# Patient Record
Sex: Male | Born: 2000 | Race: White | Hispanic: No | Marital: Single | Smoking: Former smoker
Health system: Southern US, Community
[De-identification: ages and names within clinical notes are randomized; demographics above are authoritative.]

## PROBLEM LIST (undated history)

## (undated) DIAGNOSIS — F902 Attention-deficit hyperactivity disorder, combined type: Secondary | ICD-10-CM

## (undated) DIAGNOSIS — F419 Anxiety disorder, unspecified: Secondary | ICD-10-CM

## (undated) DIAGNOSIS — F909 Attention-deficit hyperactivity disorder, unspecified type: Secondary | ICD-10-CM

## (undated) HISTORY — DX: Attention-deficit hyperactivity disorder, combined type: F90.2

## (undated) HISTORY — PX: MOUTH SURGERY: SHX715

---
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
## 2020-01-19 ENCOUNTER — Ambulatory Visit (INDEPENDENT_AMBULATORY_CARE_PROVIDER_SITE_OTHER): Payer: Medicaid Other | Admitting: Psychiatry

## 2020-01-19 ENCOUNTER — Other Ambulatory Visit: Payer: Self-pay

## 2020-01-19 DIAGNOSIS — F411 Generalized anxiety disorder: Secondary | ICD-10-CM

## 2020-01-19 NOTE — BH Specialist Note (Signed)
Integrated Behavioral Health Follow Up Visit  MRN: [id number] Name: John Campbell  Number of Integrated Behavioral Health Clinician visits: 2/6 Session Start time: 2:18 pm  Session End time: 3:10 pm Total time: 52  Type of Service: Integrated Behavioral Health- Individual Interpretor:No. Interpretor Name and Language: NA  SUBJECTIVE: John Campbell is a 19 y.o. male accompanied by self Patient was referred by Dr. Georgeanne Nim for anxiety. Patient reports the following symptoms/concerns: having continued symptoms of anxiety but not identifying any stressors in particular.  Duration of problem: 1-2 months; Severity of problem: mild  OBJECTIVE: Mood: Pleasant and Affect: Appropriate Risk of harm to self or others: No plan to harm self or others  LIFE CONTEXT: Family and Social: Lives with his mother, father, and two brothers and shared that things are going okay in the home but he has desires to move back on his own again.  School/Work: Currently in his senior year at [employer].  Self-Care: Reports that he still gets anxious but has not had any panic attacks recently.  Life Changes: None at present.   GOALS ADDRESSED: Patient will: 1.  Reduce symptoms of: anxiety to less than 3 out of 7 days a week.  2.  Increase knowledge and/or ability of: coping skills  3.  Demonstrate ability to: Increase healthy adjustment to current life circumstances  INTERVENTIONS: Interventions utilized:  Motivational Interviewing and Brief CBT To build rapport and engage the patient in an activity that allowed the patient to share their interests, family and peer dynamics, and personal and therapeutic goals. The therapist used a visual to engage the patient in identifying how thoughts and feelings impact actions. They discussed ways to reduce negative thought patterns and use coping skills to reduce negative symptoms. Therapist praised this response and they explored what will be helpful in improving  reactions to emotions. Standardized Assessments completed: Not Needed  ASSESSMENT: Patient currently experiencing moments of anxiety due to stressors in his life. He explored how balancing school, moving back in with his parents, coping with his ex-girlfriend, parenting his son, and focusing on his own wellbeing can be difficult at times. They reviewed ways to set boundaries and use coping skills to challenge anxious thoughts and feelings. He shared that some of his coping strategies are: playing his video games, riding his dirt bike, hanging with friends, driving, listening to music, working on mechanical things, and talking to his younger brother.   Patient may benefit from individual counseling to improve his anxiety.  PLAN: 1. Follow up with behavioral health clinician in: one month 2. Behavioral recommendations: explore additional ways to cope and create a list; discuss and complete the can and cannot control activity.  3. Referral(s): Integrated [facility] (In Clinic) 4. "From scale of 1-10, how likely are you to follow plan?": 5  Jana Half, [facility]

## 2020-01-29 ENCOUNTER — Ambulatory Visit: Payer: Medicaid Other | Admitting: Pediatrics

## 2020-02-03 ENCOUNTER — Ambulatory Visit: Payer: Medicaid Other | Admitting: Pediatrics

## 2020-02-09 ENCOUNTER — Other Ambulatory Visit: Payer: Self-pay

## 2020-02-09 ENCOUNTER — Ambulatory Visit (INDEPENDENT_AMBULATORY_CARE_PROVIDER_SITE_OTHER): Payer: Medicaid Other | Admitting: Pediatrics

## 2020-02-09 ENCOUNTER — Encounter: Payer: Self-pay | Admitting: Pediatrics

## 2020-02-09 VITALS — BP 140/88 | HR 64 | Ht 70.39 in | Wt 179.0 lb

## 2020-02-09 DIAGNOSIS — F410 Panic disorder [episodic paroxysmal anxiety] without agoraphobia: Secondary | ICD-10-CM

## 2020-02-09 DIAGNOSIS — F411 Generalized anxiety disorder: Secondary | ICD-10-CM | POA: Diagnosis not present

## 2020-02-09 DIAGNOSIS — G4709 Other insomnia: Secondary | ICD-10-CM | POA: Diagnosis not present

## 2020-02-09 DIAGNOSIS — F902 Attention-deficit hyperactivity disorder, combined type: Secondary | ICD-10-CM

## 2020-02-09 MED ORDER — LISDEXAMFETAMINE DIMESYLATE 70 MG PO CAPS: 70.0000 mg | ORAL_CAPSULE | ORAL | 0 refills | Status: DC

## 2020-02-09 MED ORDER — SERTRALINE HCL 50 MG PO TABS: 50.0000 mg | ORAL_TABLET | Freq: Every day | ORAL | 2 refills | Status: DC

## 2020-02-09 MED ORDER — AMPHETAMINE-DEXTROAMPHETAMINE 15 MG PO TABS: ORAL_TABLET | ORAL | 0 refills | Status: DC

## 2020-02-09 MED ORDER — CLONIDINE HCL 0.1 MG PO TABS: 0.2000 mg | ORAL_TABLET | Freq: Every evening | ORAL | 2 refills | Status: DC | PRN

## 2020-02-09 NOTE — Progress Notes (Signed)
Name: John Campbell Age: 19 y.o. Sex: male DOB: 23-Jul-2000 MRN: [id number] Date of office visit: 02/09/2020    Chief Complaint  Patient presents with  . recheck ADHD  . recheck insomnia  . recheck anxiety    Here by himself     John Campbell is a 19 y.o. male here for recheck of ADHD.  The patient is the primary historian.  ADHD: The patient has ADHD combined type. He takes Vyvanse 70 mg in the morning and Adderall immediate release 15 mg in the afternoon. He feels he is doing well on his current medications. He is able to focus in school without difficulty.  Grade in School: 12th grade. Grades: B's and C's. School Performance Problems: None. Side Effects of Medication: Patient denies any side effects.  Sleep Problems: He is taking clonidine 0.1 mg at bedtime. He states he is sleeping well, 8-9 hours per night.  Behavior Problem: None. Extracurricular Activities: None. Anxiety: The patient is taking Zoloft for anxiety. At his last visit the dose was increased from 25 mg to 50 mg.  He feels his anxiety is well controlled at this dose. He has not had any panic attacks recently.  He denies side effects from the medication.  Past Medical History:  Diagnosis Date  . ADHD (attention deficit hyperactivity disorder), combined type   . Anxiety   . Other insomnia 12/09/2018     Outpatient Encounter Medications as of 02/09/2020  Medication Sig  . amphetamine-dextroamphetamine (ADDERALL) 15 MG tablet Take 1 tablet orally in the afternoon  . [START ON 03/10/2020] amphetamine-dextroamphetamine (ADDERALL) 15 MG tablet Take 1 tablet orally in the afternoon  . [START ON 04/09/2020] amphetamine-dextroamphetamine (ADDERALL) 15 MG tablet Take 1 tablet orally in the afternoon  . cloNIDine (CATAPRES) 0.1 MG tablet Take 2 tablets (0.2 mg total) by mouth at bedtime as needed.  Marland Kitchen EPINEPHrine 0.3 mg/0.3 mL IJ SOAJ injection Inject 0.3 mg into the muscle as needed for anaphylaxis.  Marland Kitchen lisdexamfetamine  (VYVANSE) 70 MG capsule Take 1 capsule (70 mg total) by mouth every morning.  Melene Muller ON 03/10/2020] lisdexamfetamine (VYVANSE) 70 MG capsule Take 1 capsule (70 mg total) by mouth every morning.  Melene Muller ON 04/09/2020] lisdexamfetamine (VYVANSE) 70 MG capsule Take 1 capsule (70 mg total) by mouth every morning.  . ondansetron (ZOFRAN) 4 MG tablet Take 1 tablet (4 mg total) by mouth every 8 (eight) hours as needed for nausea or vomiting.  . sertraline (ZOLOFT) 50 MG tablet Take 1 tablet (50 mg total) by mouth daily.  . [DISCONTINUED] amphetamine-dextroamphetamine (ADDERALL) 15 MG tablet Take 1 tablet by mouth daily in the afternoon.  . [DISCONTINUED] cloNIDine (CATAPRES) 0.1 MG tablet Take 2 tablets (0.2 mg total) by mouth at bedtime as needed.  . [DISCONTINUED] dicyclomine (BENTYL) 20 MG tablet Take 1 tablet (20 mg total) by mouth 4 (four) times daily -  before meals and at bedtime. (Patient not taking: Reported on 12/03/2019)  . [DISCONTINUED] lisdexamfetamine (VYVANSE) 70 MG capsule Take 1 capsule (70 mg total) by mouth every morning.  . [DISCONTINUED] sertraline (ZOLOFT) 50 MG tablet Take 1 tablet (50 mg total) by mouth daily.   No facility-administered encounter medications on file as of 02/09/2020.    Allergies  Allergen Reactions  . Latex Hives  . Other     Peaches, onions, almonds, sweet potatoes    Past Surgical History:  Procedure Laterality Date  . MOUTH SURGERY      History reviewed. No pertinent family  history.  Pediatric History  Patient Parents  . Fickle,Cheryl (Mother)  . Wurm,Lauren (Father)   Other Topics Concern  . Not on file  Social History Narrative  . Not on file     Review of Systems:  Constitutional: Negative for fever, malaise/fatigue and weight loss.  HENT: Negative for congestion and sore throat.   Eyes: Negative for discharge and redness.  Respiratory: Negative for cough.   Cardiovascular: Negative for chest pain and palpitations.    Gastrointestinal: Negative for abdominal pain.  Musculoskeletal: Negative for myalgias.  Skin: Negative for rash.  Neurological: Negative for dizziness and headaches.    Physical Exam:  BP 140/88   Pulse 64   Ht 5' 10.39" (1.788 m)   Wt 179 lb (81.2 kg)   SpO2 97%   BMI 25.40 kg/m  Wt Readings from Last 3 Encounters:  02/09/20 179 lb (81.2 kg) (82 %, Z= 0.93)*  12/28/19 174 lb 12.8 oz (79.3 kg) (79 %, Z= 0.82)*  12/04/19 181 lb (82.1 kg) (84 %, Z= 1.01)*   * Growth percentiles are based on CDC (Boys, 2-20 Years) data.     Body mass index is 25.4 kg/m. 80 %ile (Z= 0.83) based on CDC (Boys, 2-20 Years) BMI-for-age based on BMI available as of 02/09/2020.  Physical Exam  Constitutional: Patient appears well-developed and well-nourished.  Patient is active, awake, and alert.  HENT:  Nose: Nose normal. No nasal discharge.  Mouth/Throat: Mucous membranes are moist.  Eyes: Conjunctivae are normal.  Neck: Normal range of motion. Thyroid normal.  Cardiovascular: Regular rhythm. Pulmonary/Chest: Effort normal and breath sounds normal. No respiratory distress.  There is no wheezes, rhonchi, or crackles noted. Abdominal: Soft.  No masses palpated. There is no hepatosplenomegaly. There is no abdominal tenderness.  Musculoskeletal: Normal range of motion.  Neurological: Patient is alert.  Patient exhibits normal muscle tone.  Skin: No rash noted.   Assessment/Plan:  1. Attention deficit hyperactivity disorder (ADHD), combined type This patient has chronic ADHD.  The patient's current dose is controlling the symptoms adequately.  The medicine should be taken every day as directed. This includes weekends, weekdays, visiting with other family members, summertime, and holidays. It is important for routine, consistency, and structure, for the child to consistently get medicine and feel the same every day.  - lisdexamfetamine (VYVANSE) 70 MG capsule; Take 1 capsule (70 mg total) by mouth  every morning.  Dispense: 30 capsule; Refill: 0 - lisdexamfetamine (VYVANSE) 70 MG capsule; Take 1 capsule (70 mg total) by mouth every morning.  Dispense: 30 capsule; Refill: 0 - lisdexamfetamine (VYVANSE) 70 MG capsule; Take 1 capsule (70 mg total) by mouth every morning.  Dispense: 30 capsule; Refill: 0 - amphetamine-dextroamphetamine (ADDERALL) 15 MG tablet; Take 1 tablet orally in the afternoon  Dispense: 30 tablet; Refill: 0 - amphetamine-dextroamphetamine (ADDERALL) 15 MG tablet; Take 1 tablet orally in the afternoon  Dispense: 30 tablet; Refill: 0 - amphetamine-dextroamphetamine (ADDERALL) 15 MG tablet; Take 1 tablet orally in the afternoon  Dispense: 30 tablet; Refill: 0  2. Generalized anxiety disorder with panic attacks This patient has chronic anxiety which is now adequately treated with his current dose of Zoloft 50 mg.  This medication will continue to be prescribed for him at this dose.  He should take this medication on a consistent basis every day, even if he feels his anxiety has improved.  His anxiety has improved because of the medication, not despite the medication.  Therefore, he should continue to take the  medication consistently.  - sertraline (ZOLOFT) 50 MG tablet; Take 1 tablet (50 mg total) by mouth daily.  Dispense: 30 tablet; Refill: 2  3. Other insomnia This patient has chronic insomnia.  He has been stable on clonidine for quite some time.  He may continue to take clonidine at bedtime as needed to help with insomnia.  He should maintain appropriate and consistent sleep hygiene, going to bed at same time every night and getting up at the same time every day.  - cloNIDine (CATAPRES) 0.1 MG tablet; Take 2 tablets (0.2 mg total) by mouth at bedtime as needed.  Dispense: 60 tablet; Refill: 2   Meds ordered this encounter  Medications  . sertraline (ZOLOFT) 50 MG tablet    Sig: Take 1 tablet (50 mg total) by mouth daily.    Dispense:  30 tablet    Refill:  2  .  cloNIDine (CATAPRES) 0.1 MG tablet    Sig: Take 2 tablets (0.2 mg total) by mouth at bedtime as needed.    Dispense:  60 tablet    Refill:  2  . lisdexamfetamine (VYVANSE) 70 MG capsule    Sig: Take 1 capsule (70 mg total) by mouth every morning.    Dispense:  30 capsule    Refill:  0  . lisdexamfetamine (VYVANSE) 70 MG capsule    Sig: Take 1 capsule (70 mg total) by mouth every morning.    Dispense:  30 capsule    Refill:  0  . lisdexamfetamine (VYVANSE) 70 MG capsule    Sig: Take 1 capsule (70 mg total) by mouth every morning.    Dispense:  30 capsule    Refill:  0  . amphetamine-dextroamphetamine (ADDERALL) 15 MG tablet    Sig: Take 1 tablet orally in the afternoon    Dispense:  30 tablet    Refill:  0  . amphetamine-dextroamphetamine (ADDERALL) 15 MG tablet    Sig: Take 1 tablet orally in the afternoon    Dispense:  30 tablet    Refill:  0  . amphetamine-dextroamphetamine (ADDERALL) 15 MG tablet    Sig: Take 1 tablet orally in the afternoon    Dispense:  30 tablet    Refill:  0    Total personal time spent on the date of this encounter: 40 minutes.  Return in about 3 months (around 05/11/2020) for recheck ADHD/anxiety/insomnia.

## 2020-02-18 ENCOUNTER — Ambulatory Visit: Payer: Medicaid Other

## 2020-03-08 ENCOUNTER — Ambulatory Visit: Payer: Medicaid Other

## 2020-03-14 ENCOUNTER — Ambulatory Visit: Payer: Medicaid Other

## 2020-03-14 ENCOUNTER — Other Ambulatory Visit: Payer: Self-pay

## 2020-05-10 ENCOUNTER — Encounter: Payer: Self-pay | Admitting: Pediatrics

## 2020-05-10 ENCOUNTER — Other Ambulatory Visit: Payer: Self-pay

## 2020-05-10 ENCOUNTER — Ambulatory Visit (INDEPENDENT_AMBULATORY_CARE_PROVIDER_SITE_OTHER): Payer: Medicaid Other | Admitting: Pediatrics

## 2020-05-10 VITALS — BP 137/82 | HR 72 | Ht 69.96 in | Wt 185.8 lb

## 2020-05-10 DIAGNOSIS — G4709 Other insomnia: Secondary | ICD-10-CM | POA: Diagnosis not present

## 2020-05-10 DIAGNOSIS — F411 Generalized anxiety disorder: Secondary | ICD-10-CM

## 2020-05-10 DIAGNOSIS — F902 Attention-deficit hyperactivity disorder, combined type: Secondary | ICD-10-CM | POA: Diagnosis not present

## 2020-05-10 MED ORDER — LISDEXAMFETAMINE DIMESYLATE 70 MG PO CAPS
70.0000 mg | ORAL_CAPSULE | ORAL | 0 refills | Status: DC
Start: 2020-05-10 — End: 2020-08-09

## 2020-05-10 MED ORDER — AMPHETAMINE-DEXTROAMPHETAMINE 15 MG PO TABS: ORAL_TABLET | ORAL | 0 refills | Status: DC

## 2020-05-10 MED ORDER — SERTRALINE HCL 50 MG PO TABS: 50.0000 mg | ORAL_TABLET | Freq: Every day | ORAL | 2 refills | Status: DC

## 2020-05-10 MED ORDER — LISDEXAMFETAMINE DIMESYLATE 70 MG PO CAPS: 70.0000 mg | ORAL_CAPSULE | ORAL | 0 refills | Status: DC

## 2020-05-10 MED ORDER — CLONIDINE HCL 0.1 MG PO TABS: 0.2000 mg | ORAL_TABLET | Freq: Every evening | ORAL | 2 refills | Status: DC | PRN

## 2020-05-10 NOTE — Progress Notes (Signed)
Name: John Campbell Age: 20 y.o. Sex: male DOB: 22-Sep-2000 MRN: [id number] Date of office visit: 05/10/2020    Chief Complaint  Patient presents with  . Recheck ADHD  . Recheck insomnia  . Recheck anxiety    Patient is here by himself     John Campbell is a 20 y.o. male here for recheck of ADHD.  The patient is the primary historian.  ADHD: The patient has ADHD combined type. He takes Vyvanse 70mg  in the morning and Adderall 15mg  immediate release in the afternoon. The patient is tolerating his current medication regime well and is focusing well in school and extracurricular tasks. He sometimes skips his medication on the weekend days.  Grade in School: 12th grade. Grades: doing ok. Bs and Cs. He is confident he will graduate high school this year.  School Performance Problems: none. Side Effects of Medication: none. The patient denies loss of appetite, chest pain, heart palpitations, dizziness, shortness of breath, or feelings of depression. Sleep Problems: none. The patient takes Clonidine 0.1mg  2 tablets at bedtime. He is sleeping around 9 hours per night and does not have any trouble falling or staying asleep.  Behavior Problem: none. Extracurricular Activities: Rides motorcycle for fun. Anxiety: none. The patient takes Zoloft 50mg  daily for anxiety. He feels his anxiety is well controlled on this current medication. He describes rare feelings of anxiety which are situation.  His anxiety is mild compared to his anxiety level on the lower dose of Zoloft. He denies any recent panic attacks.    Past Medical History:  Diagnosis Date  . ADHD (attention deficit hyperactivity disorder), combined type   . Anxiety   . Other insomnia 12/09/2018     Outpatient Encounter Medications as of 05/10/2020  Medication Sig  . amphetamine-dextroamphetamine (ADDERALL) 15 MG tablet Take 1 tablet orally in the afternoon  . [START ON 06/09/2020] amphetamine-dextroamphetamine (ADDERALL) 15 MG tablet  Take 1 tablet orally in the afternoon  . [START ON 07/09/2020] amphetamine-dextroamphetamine (ADDERALL) 15 MG tablet Take 1 tablet orally in the afternoon  . EPINEPHrine 0.3 mg/0.3 mL IJ SOAJ injection Inject 0.3 mg into the muscle as needed for anaphylaxis.  07/08/2020 lisdexamfetamine (VYVANSE) 70 MG capsule Take 1 capsule (70 mg total) by mouth every morning.  08/09/2020 ON 06/09/2020] lisdexamfetamine (VYVANSE) 70 MG capsule Take 1 capsule (70 mg total) by mouth every morning.  Marland Kitchen ON 07/09/2020] lisdexamfetamine (VYVANSE) 70 MG capsule Take 1 capsule (70 mg total) by mouth every morning.  . [DISCONTINUED] amphetamine-dextroamphetamine (ADDERALL) 15 MG tablet Take 1 tablet orally in the afternoon  . [DISCONTINUED] cloNIDine (CATAPRES) 0.1 MG tablet Take 2 tablets (0.2 mg total) by mouth at bedtime as needed.  . [DISCONTINUED] lisdexamfetamine (VYVANSE) 70 MG capsule Take 1 capsule (70 mg total) by mouth every morning.  . [DISCONTINUED] sertraline (ZOLOFT) 50 MG tablet Take 1 tablet (50 mg total) by mouth daily.  . cloNIDine (CATAPRES) 0.1 MG tablet Take 2 tablets (0.2 mg total) by mouth at bedtime as needed.  . sertraline (ZOLOFT) 50 MG tablet Take 1 tablet (50 mg total) by mouth daily.  . [DISCONTINUED] amphetamine-dextroamphetamine (ADDERALL) 15 MG tablet Take 1 tablet orally in the afternoon  . [DISCONTINUED] amphetamine-dextroamphetamine (ADDERALL) 15 MG tablet Take 1 tablet orally in the afternoon  . [DISCONTINUED] lisdexamfetamine (VYVANSE) 70 MG capsule Take 1 capsule (70 mg total) by mouth every morning.  . [DISCONTINUED] lisdexamfetamine (VYVANSE) 70 MG capsule Take 1 capsule (70 mg total) by mouth every morning.  . [  DISCONTINUED] ondansetron (ZOFRAN) 4 MG tablet Take 1 tablet (4 mg total) by mouth every 8 (eight) hours as needed for nausea or vomiting. (Patient not taking: Reported on 05/10/2020)   No facility-administered encounter medications on file as of 05/10/2020.    Allergies  Allergen  Reactions  . Latex Hives  . Other     Peaches, onions, almonds, sweet potatoes    Past Surgical History:  Procedure Laterality Date  . MOUTH SURGERY      History reviewed. No pertinent family history.  Pediatric History  Patient Parents  . Oren,Cheryl (Mother)  . Streight,Mousa (Father)   Other Topics Concern  . Not on file  Social History Narrative  . Not on file     Review of Systems:  Constitutional: Negative for fever, malaise/fatigue and weight loss.  HENT: Negative for congestion and sore throat.   Eyes: Negative for discharge and redness.  Respiratory: Negative for cough.   Cardiovascular: Negative for chest pain and palpitations.  Gastrointestinal: Negative for abdominal pain.  Musculoskeletal: Negative for myalgias.  Skin: Negative for rash.  Neurological: Negative for dizziness and headaches.    Physical Exam:  BP 137/82   Pulse 72   Ht 5' 9.96" (1.777 m)   Wt 185 lb 12.8 oz (84.3 kg)   SpO2 96%   BMI 26.69 kg/m  Wt Readings from Last 3 Encounters:  05/10/20 185 lb 12.8 oz (84.3 kg) (86 %, Z= 1.10)*  02/09/20 179 lb (81.2 kg) (82 %, Z= 0.93)*  12/28/19 174 lb 12.8 oz (79.3 kg) (79 %, Z= 0.82)*   * Growth percentiles are based on CDC (Boys, 2-20 Years) data.     Body mass index is 26.69 kg/m. 86 %ile (Z= 1.08) based on CDC (Boys, 2-20 Years) BMI-for-age based on BMI available as of 05/10/2020.  Physical Exam  Constitutional: Patient appears well-developed and well-nourished.  Patient is active, awake, and alert.  HENT:  Nose: Nose normal. No nasal discharge.  Mouth/Throat: Mucous membranes are moist.  Eyes: Conjunctivae are normal.  Neck: Normal range of motion. Thyroid normal.  Cardiovascular: Regular rhythm. Pulmonary/Chest: Effort normal and breath sounds normal. No respiratory distress.  There is no wheezes, rhonchi, or crackles noted. Abdominal: Soft.  No masses palpated. There is no hepatosplenomegaly. There is no abdominal tenderness.   Musculoskeletal: Normal range of motion.  Neurological: Patient is alert.  Patient exhibits normal muscle tone.  Skin: No rash noted.   Assessment/Plan:  1. Attention deficit hyperactivity disorder (ADHD), combined type This patient has chronic ADHD.  The patient's current dose is controlling the symptoms adequately.  The medicine should be taken every day as directed. This includes weekends, weekdays, visiting with other family members, summertime, and holidays. It is important for routine, consistency, and structure, for the child to consistently get medicine and feel the same every day.  - lisdexamfetamine (VYVANSE) 70 MG capsule; Take 1 capsule (70 mg total) by mouth every morning.  Dispense: 30 capsule; Refill: 0 - lisdexamfetamine (VYVANSE) 70 MG capsule; Take 1 capsule (70 mg total) by mouth every morning.  Dispense: 30 capsule; Refill: 0 - lisdexamfetamine (VYVANSE) 70 MG capsule; Take 1 capsule (70 mg total) by mouth every morning.  Dispense: 30 capsule; Refill: 0 - amphetamine-dextroamphetamine (ADDERALL) 15 MG tablet; Take 1 tablet orally in the afternoon  Dispense: 30 tablet; Refill: 0 - amphetamine-dextroamphetamine (ADDERALL) 15 MG tablet; Take 1 tablet orally in the afternoon  Dispense: 30 tablet; Refill: 0 - amphetamine-dextroamphetamine (ADDERALL) 15 MG tablet; Take 1  tablet orally in the afternoon  Dispense: 30 tablet; Refill: 0  2. Generalized anxiety disorder This patient has chronic generalized anxiety disorder, sometimes with panic attacks although this has been improved with his current dose of Zoloft.  He should continue to take Zoloft on a consistent basis to help relieve his anxiety symptoms.  - sertraline (ZOLOFT) 50 MG tablet; Take 1 tablet (50 mg total) by mouth daily.  Dispense: 30 tablet; Refill: 2  3. Other insomnia This patient has chronic insomnia and uses clonidine regularly to help with his insomnia.  He should maintain appropriate and consistent sleep  hygiene, going to bed at same time every night and getting up the same time every morning.  Prescription refill of clonidine will be sent to the pharmacy.  - cloNIDine (CATAPRES) 0.1 MG tablet; Take 2 tablets (0.2 mg total) by mouth at bedtime as needed.  Dispense: 60 tablet; Refill: 2   Meds ordered this encounter  Medications  . sertraline (ZOLOFT) 50 MG tablet    Sig: Take 1 tablet (50 mg total) by mouth daily.    Dispense:  30 tablet    Refill:  2  . cloNIDine (CATAPRES) 0.1 MG tablet    Sig: Take 2 tablets (0.2 mg total) by mouth at bedtime as needed.    Dispense:  60 tablet    Refill:  2  . lisdexamfetamine (VYVANSE) 70 MG capsule    Sig: Take 1 capsule (70 mg total) by mouth every morning.    Dispense:  30 capsule    Refill:  0  . lisdexamfetamine (VYVANSE) 70 MG capsule    Sig: Take 1 capsule (70 mg total) by mouth every morning.    Dispense:  30 capsule    Refill:  0  . lisdexamfetamine (VYVANSE) 70 MG capsule    Sig: Take 1 capsule (70 mg total) by mouth every morning.    Dispense:  30 capsule    Refill:  0  . amphetamine-dextroamphetamine (ADDERALL) 15 MG tablet    Sig: Take 1 tablet orally in the afternoon    Dispense:  30 tablet    Refill:  0  . amphetamine-dextroamphetamine (ADDERALL) 15 MG tablet    Sig: Take 1 tablet orally in the afternoon    Dispense:  30 tablet    Refill:  0  . amphetamine-dextroamphetamine (ADDERALL) 15 MG tablet    Sig: Take 1 tablet orally in the afternoon    Dispense:  30 tablet    Refill:  0     Return in about 3 months (around 08/07/2020) for recheck ADHD/insomnia/anxiety.

## 2020-05-20 ENCOUNTER — Ambulatory Visit: Payer: Medicaid Other

## 2020-07-09 ENCOUNTER — Emergency Department (HOSPITAL_COMMUNITY)
Admission: EM | Admit: 2020-07-09 | Discharge: 2020-07-09 | Disposition: A | Discharge status: Home / Self Care | Payer: Medicaid Other | Attending: Emergency Medicine | Admitting: Emergency Medicine

## 2020-07-09 ENCOUNTER — Encounter (HOSPITAL_COMMUNITY): Payer: Self-pay

## 2020-07-09 ENCOUNTER — Emergency Department (HOSPITAL_COMMUNITY): Payer: Medicaid Other

## 2020-07-09 ENCOUNTER — Other Ambulatory Visit: Payer: Self-pay

## 2020-07-09 DIAGNOSIS — X501XXA Overexertion from prolonged static or awkward postures, initial encounter: Secondary | ICD-10-CM | POA: Diagnosis not present

## 2020-07-09 DIAGNOSIS — S93401A Sprain of unspecified ligament of right ankle, initial encounter: Secondary | ICD-10-CM

## 2020-07-09 DIAGNOSIS — Z91040 Latex allergy status: Secondary | ICD-10-CM | POA: Insufficient documentation

## 2020-07-09 DIAGNOSIS — Z87891 Personal history of nicotine dependence: Secondary | ICD-10-CM | POA: Diagnosis not present

## 2020-07-09 DIAGNOSIS — Y92009 Unspecified place in unspecified non-institutional (private) residence as the place of occurrence of the external cause: Secondary | ICD-10-CM | POA: Insufficient documentation

## 2020-07-09 DIAGNOSIS — S82831A Other fracture of upper and lower end of right fibula, initial encounter for closed fracture: Secondary | ICD-10-CM | POA: Diagnosis not present

## 2020-07-09 DIAGNOSIS — Y9367 Activity, basketball: Secondary | ICD-10-CM | POA: Insufficient documentation

## 2020-07-09 DIAGNOSIS — S99911A Unspecified injury of right ankle, initial encounter: Secondary | ICD-10-CM | POA: Diagnosis present

## 2020-07-09 NOTE — ED Notes (Signed)
CAM boot applied. Demonstrated to pt how to use at home. Pt demonstrated use of crutches. Family here to take him home. Pt aware to call listed specialist Monday to make f/u appointment. Explained s/s to watch for in case he needs to return. No additional questions at discharge.

## 2020-07-09 NOTE — ED Provider Notes (Addendum)
[facility] EMERGENCY DEPARTMENT Provider Note   CSN: [id number] Arrival date & time: 07/09/20  1551     History Chief Complaint  Patient presents with  . Ankle Pain  . Joint Swelling    John Campbell is a 20 y.o. male.  Pt playing bball at home, twisted right ankle on edge of court. C/o acute onset right ankle pain, mod-severe, constant, worse w palpation and wt bearing. Skin is intact. No prox tib/fib or knee pain. Pt denies any other pain or injury. No associated numbness or weakness. No faintness or dizziness.   The history is provided by the patient.  Ankle Pain Associated symptoms: no fever        Past Medical History:  Diagnosis Date  . ADHD (attention deficit hyperactivity disorder), combined type   . Anxiety   . Other insomnia 12/09/2018    Patient Active Problem List   Diagnosis Date Noted  . Generalized anxiety disorder with panic attacks 12/03/2019  . Allergy to other foods 12/12/2018  . Underachievement in school 12/12/2018  . Other problems related to lifestyle 12/12/2018  . Attention deficit hyperactivity disorder (ADHD), combined type 12/09/2018  . Other insomnia 12/09/2018    Past Surgical History:  Procedure Laterality Date  . MOUTH SURGERY         No family history on file.  Social History   Tobacco Use  . Smoking status: Former Smoker    Quit date: 12/01/2019    Years since quitting: 0.6  . Smokeless tobacco: Never Used  Vaping Use  . Vaping Use: Never used  Substance Use Topics  . Alcohol use: No  . Drug use: Yes    Types: Marijuana    Comment: states he has not used in weeks    Home Medications Prior to Admission medications   Medication Sig Start Date End Date Taking? Authorizing Provider  amphetamine-dextroamphetamine (ADDERALL) 15 MG tablet Take 1 tablet orally in the afternoon 05/10/20 06/09/20  Antonietta Barcelona, MD  amphetamine-dextroamphetamine (ADDERALL) 15 MG tablet Take 1 tablet orally in the afternoon 06/09/20 07/09/20  Antonietta Barcelona,  MD  amphetamine-dextroamphetamine (ADDERALL) 15 MG tablet Take 1 tablet orally in the afternoon 07/09/20 08/08/20  Antonietta Barcelona, MD  cloNIDine (CATAPRES) 0.1 MG tablet Take 2 tablets (0.2 mg total) by mouth at bedtime as needed. 05/10/20 08/08/20  Antonietta Barcelona, MD  EPINEPHrine 0.3 mg/0.3 mL IJ SOAJ injection Inject 0.3 mg into the muscle as needed for anaphylaxis.    [provider]  lisdexamfetamine (VYVANSE) 70 MG capsule Take 1 capsule (70 mg total) by mouth every morning. 05/10/20 06/09/20  Antonietta Barcelona, MD  lisdexamfetamine (VYVANSE) 70 MG capsule Take 1 capsule (70 mg total) by mouth every morning. 06/09/20 07/09/20  Antonietta Barcelona, MD  lisdexamfetamine (VYVANSE) 70 MG capsule Take 1 capsule (70 mg total) by mouth every morning. 07/09/20 08/08/20  Antonietta Barcelona, MD  sertraline (ZOLOFT) 50 MG tablet Take 1 tablet (50 mg total) by mouth daily. 05/10/20 08/08/20  Antonietta Barcelona, MD    Allergies    Latex and Other  Review of Systems   Review of Systems  Constitutional: Negative for fever.  Gastrointestinal: Negative for nausea and vomiting.  Musculoskeletal:       Right ankle injury.   Skin: Negative for wound.  Neurological: Negative for weakness and numbness.  Hematological: Does not bruise/bleed easily.    Physical Exam Updated Vital Signs BP 121/78 (BP Location: Left Arm)   Pulse 69   Temp 97.8 F (36.6 C) (  Oral)   Resp 19   Ht 1.778 m (5\' 10" )   Wt 82.1 kg   SpO2 100%   BMI 25.97 kg/m   Physical Exam Vitals and nursing note reviewed.  Constitutional:      Appearance: Normal appearance. He is well-developed.  HENT:     Head: Atraumatic.     Nose: Nose normal.     Mouth/Throat:     Mouth: Mucous membranes are moist.  Eyes:     General: No scleral icterus.    Conjunctiva/sclera: Conjunctivae normal.  Neck:     Trachea: No tracheal deviation.  Cardiovascular:     Rate and Rhythm: Normal rate.     Pulses: Normal pulses.  Pulmonary:     Effort: Pulmonary effort is normal. No accessory muscle  usage or respiratory distress.  Abdominal:     General: There is no distension.  Genitourinary:    Comments: No cva tenderness. Musculoskeletal:     Cervical back: Neck supple.     Comments: sts and tenderness right ankle, medial and lateral malleolus. Ankle is grossly stable. Distal pulses palp. No proximal tib fib or knee pain or tenderness. No lower leg swelling or tenderness.   Skin:    General: Skin is warm and dry.     Findings: No rash.  Neurological:     Mental Status: He is alert.     Comments: Alert, speech clear. Right foot nvi, w intact motor/sens fxn.   Psychiatric:        Mood and Affect: Mood normal.     ED Results / Procedures / Treatments   Labs (all labs ordered are listed, but only abnormal results are displayed) Labs Reviewed - No data to display  EKG None  Radiology DG Ankle Complete Right  Result Date: 07/09/2020 CLINICAL DATA:  Twisting right ankle injury today. EXAM: RIGHT ANKLE - COMPLETE 3+ VIEW COMPARISON:  None. FINDINGS: Moderate soft tissue swelling over the anterior lateral ankle. Ankle mortise is normal. Suggestion of subtle focal cortical lucency along the lateral aspect of the distal fibular head at the level of the ankle mortise suggesting a nondisplaced fracture. Remainder of the exam is unremarkable. IMPRESSION: Suggestion of a subtle nondisplaced fracture involving the distal fibular head. Associated soft tissue swelling. Electronically Signed   By: 09/08/2020 M.D.   On: 07/09/2020 16:55    Procedures Procedures   Medications Ordered in ED Medications - No data to display  ED Course  I have reviewed the triage vital signs and the nursing notes.  Pertinent labs & imaging results that were available during my care of the patient were reviewed by me and considered in my medical decision making (see chart for details).    MDM Rules/Calculators/A&P                          Imaging ordered. Icepack.   Reviewed nursing notes and prior  charts for additional history.   Xrays reviewed/interpreted by me - ?non displaced fx.   Cam walker boot. Crutches. Ice.   Acetaminophen po.   Ortho f/u.   Return precautions provided.      Final Clinical Impression(s) / ED Diagnoses Final diagnoses:  None    Rx / DC Orders ED Discharge Orders    None          09/08/2020, MD 07/09/20 1708

## 2020-07-09 NOTE — Discharge Instructions (Signed)
It was our pleasure to provide your ER care today - we hope that you feel better.  Elevate foot/ankle. Wear camwalker boot/use crutches.   Take acetaminophen or ibuprofen as need for pain  Follow up with orthopedist this coming week - call office Monday AM to arrange appointment.   Return to ER if worse, new symptoms, new/severe pain, or other concern.

## 2020-07-09 NOTE — ED Triage Notes (Signed)
Pt playing basketball, landed on side of driveway and right ankle twisted. Pt felt popping sensation. Lateral swelling of ankle, no open fracture. +pedal pulse and sensation. Pt able to bear minimum weight after injury. No prior injury to ankle or foot before. Able to move toes. No Loc

## 2020-08-03 ENCOUNTER — Ambulatory Visit: Payer: Medicaid Other

## 2020-08-03 ENCOUNTER — Ambulatory Visit: Payer: Medicaid Other | Admitting: Pediatrics

## 2020-08-09 ENCOUNTER — Ambulatory Visit (INDEPENDENT_AMBULATORY_CARE_PROVIDER_SITE_OTHER): Payer: Medicaid Other | Admitting: Pediatrics

## 2020-08-09 ENCOUNTER — Other Ambulatory Visit: Payer: Self-pay

## 2020-08-09 ENCOUNTER — Encounter: Payer: Self-pay | Admitting: Pediatrics

## 2020-08-09 DIAGNOSIS — F411 Generalized anxiety disorder: Secondary | ICD-10-CM | POA: Diagnosis not present

## 2020-08-09 DIAGNOSIS — G4709 Other insomnia: Secondary | ICD-10-CM

## 2020-08-09 DIAGNOSIS — F902 Attention-deficit hyperactivity disorder, combined type: Secondary | ICD-10-CM

## 2020-08-09 MED ORDER — CLONIDINE HCL 0.1 MG PO TABS: 0.2000 mg | ORAL_TABLET | Freq: Every evening | ORAL | 0 refills | Status: DC | PRN

## 2020-08-09 MED ORDER — AMPHETAMINE-DEXTROAMPHETAMINE 15 MG PO TABS: ORAL_TABLET | ORAL | 0 refills | Status: DC

## 2020-08-09 MED ORDER — LISDEXAMFETAMINE DIMESYLATE 70 MG PO CAPS: 70.0000 mg | ORAL_CAPSULE | ORAL | 0 refills | Status: DC

## 2020-08-09 MED ORDER — SERTRALINE HCL 50 MG PO TABS: 50.0000 mg | ORAL_TABLET | Freq: Every day | ORAL | 0 refills | Status: DC

## 2020-08-09 NOTE — Progress Notes (Signed)
   Patient Name:  John Campbell Date of Birth:  Aug 30, 2000 Age:  20 y.o. Date of Visit:  08/09/2020   Accompanied by: himself   (primary historian)   Leodis has a health related screening related to ADHD management. He has a concurrent diagnosis of insomnia and anxiety.   He was last seen by Dr. Georgeanne Nim on 05/10/2020  He reports no problems.    Current Outpatient Medications on File Prior to Visit  Medication Sig Dispense Refill  . EPINEPHrine 0.3 mg/0.3 mL IJ SOAJ injection Inject 0.3 mg into the muscle as needed for anaphylaxis.     No current facility-administered medications on file prior to visit.    Today's Vitals   08/09/20 1524  BP: 128/75  Pulse: 81  SpO2: 97%  Weight: 172 lb 9.6 oz (78.3 kg)  Height: 5' 9.37" (1.762 m)   Wt Readings from Last 3 Encounters:  08/09/20 172 lb 9.6 oz (78.3 kg) (75 %, Z= 0.67)*  07/09/20 181 lb (82.1 kg) (83 %, Z= 0.94)*  05/10/20 185 lb 12.8 oz (84.3 kg) (86 %, Z= 1.10)*   * Growth percentiles are based on CDC (Boys, 2-20 Years) data.     Refills were provided.  Meds ordered this encounter  Medications  . amphetamine-dextroamphetamine (ADDERALL) 15 MG tablet    Sig: Take 1 tablet orally in the afternoon    Dispense:  30 tablet    Refill:  0  . lisdexamfetamine (VYVANSE) 70 MG capsule    Sig: Take 1 capsule (70 mg total) by mouth every morning.    Dispense:  30 capsule    Refill:  0  . sertraline (ZOLOFT) 50 MG tablet    Sig: Take 1 tablet (50 mg total) by mouth daily.    Dispense:  30 tablet    Refill:  0  . cloNIDine (CATAPRES) 0.1 MG tablet    Sig: Take 2 tablets (0.2 mg total) by mouth at bedtime as needed.    Dispense:  60 tablet    Refill:  0    His next provider visit will be in 4 weeks.

## 2020-09-06 ENCOUNTER — Telehealth: Payer: Self-pay | Admitting: Pediatrics

## 2020-09-06 DIAGNOSIS — F411 Generalized anxiety disorder: Secondary | ICD-10-CM

## 2020-09-06 DIAGNOSIS — G4709 Other insomnia: Secondary | ICD-10-CM

## 2020-09-06 DIAGNOSIS — F902 Attention-deficit hyperactivity disorder, combined type: Secondary | ICD-10-CM

## 2020-09-06 NOTE — Telephone Encounter (Signed)
Mother states that patient's next appt is 10/05/20 and after nurse visit she was told to call ahead of time to request refills.  Patient needs Vyvanse 70mg , Zoloft 50mg , Adderall 15mg  and Clondine 0.1mg .  They use [facility].

## 2020-09-07 MED ORDER — CLONIDINE HCL 0.1 MG PO TABS: 0.2000 mg | ORAL_TABLET | Freq: Every evening | ORAL | 0 refills | Status: DC | PRN

## 2020-09-07 MED ORDER — LISDEXAMFETAMINE DIMESYLATE 70 MG PO CAPS: 70.0000 mg | ORAL_CAPSULE | ORAL | 0 refills | Status: DC

## 2020-09-07 MED ORDER — AMPHETAMINE-DEXTROAMPHETAMINE 15 MG PO TABS: ORAL_TABLET | ORAL | 0 refills | Status: DC

## 2020-09-07 MED ORDER — SERTRALINE HCL 50 MG PO TABS: 50.0000 mg | ORAL_TABLET | Freq: Every day | ORAL | 0 refills | Status: DC

## 2020-09-25 ENCOUNTER — Other Ambulatory Visit: Payer: Self-pay

## 2020-09-25 ENCOUNTER — Encounter: Payer: Self-pay | Admitting: Emergency Medicine

## 2020-09-25 ENCOUNTER — Ambulatory Visit
Admission: EM | Admit: 2020-09-25 | Discharge: 2020-09-25 | Disposition: A | Discharge status: Home / Self Care | Payer: Medicaid Other | Attending: Family Medicine | Admitting: Family Medicine

## 2020-09-25 DIAGNOSIS — J029 Acute pharyngitis, unspecified: Secondary | ICD-10-CM | POA: Diagnosis not present

## 2020-09-25 DIAGNOSIS — J069 Acute upper respiratory infection, unspecified: Secondary | ICD-10-CM

## 2020-09-25 MED ORDER — PSEUDOEPHEDRINE HCL ER 120 MG PO TB12: 120.0000 mg | ORAL_TABLET | Freq: Two times a day (BID) | ORAL | 0 refills | Status: DC

## 2020-09-25 MED ORDER — IPRATROPIUM BROMIDE 0.03 % NA SOLN: 2.0000 | Freq: Two times a day (BID) | NASAL | 0 refills | Status: DC

## 2020-09-25 MED ORDER — LEVOCETIRIZINE DIHYDROCHLORIDE 5 MG PO TABS: 5.0000 mg | ORAL_TABLET | Freq: Every evening | ORAL | 0 refills | Status: DC

## 2020-09-25 MED ORDER — LIDOCAINE VISCOUS HCL 2 % MT SOLN: 15.0000 mL | OROMUCOSAL | 0 refills | Status: DC | PRN

## 2020-09-25 NOTE — ED Triage Notes (Signed)
Sore throat for past couple of days

## 2020-09-25 NOTE — Discharge Instructions (Addendum)
Your COVID 19 results should result within 3-5 days. Negative results are immediately resulted to Mychart. Positive results will receive a follow-up call from our clinic. If symptoms are present, I recommend home quarantine until results are known.  Alternate Tylenol and ibuprofen as needed for body aches and fever.  Take medication as prescribed.  Symptom management per recommendations discussed today.  If any breathing difficulty or chest pain develops go immediately to the closest emergency department for evaluation.

## 2020-09-25 NOTE — ED Provider Notes (Signed)
[facility]    CSN: [id number] Arrival date & time: 09/25/20  1006      History   Chief Complaint Chief Complaint  Patient presents with   Sore Throat    HPI John Campbell is a 20 y.o. male.   HPI Patient presents today with sore throat x2 days, body aches, subjective fever, cough, purulent nasal drainage.  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  He has been taking ibuprofen for management of body aches.  He is currently afebrile.  Has no known history of recurrent strep and infection.  Denies any shortness of breath or wheezing.  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 Authorizing Provider  ipratropium (ATROVENT) 0.03 % nasal spray Place 2 sprays into both nostrils 2 (two) times daily. 09/25/20  Yes Bing Neighbors, FNP  levocetirizine (XYZAL) 5 MG tablet Take 1 tablet (5 mg total) by mouth every evening. 09/25/20  Yes Bing Neighbors, FNP  lidocaine (XYLOCAINE) 2 % solution Use as directed 15 mLs in the mouth or throat every 3 (three) hours as needed (Sore  throat. Mix warm water, gargle and spit). 09/25/20  Yes Bing Neighbors, FNP  pseudoephedrine (SUDAFED 12 HOUR) 120 MG 12 hr tablet Take 1 tablet (120 mg total) by mouth 2  (two) times daily for 7 days. 09/25/20 10/02/20 Yes Bing Neighbors, FNP  amphetamine-dextroamphetamine (ADDERALL) 15 MG tablet Take 1 tablet orally in the afternoon 09/07/20 10/07/20  Johny Drilling, DO  cloNIDine (CATAPRES) 0.1 MG tablet Take 2 tablets (0.2 mg total) by mouth at bedtime as needed. 09/07/20 12/06/20  Johny Drilling, DO  EPINEPHrine 0.3 mg/0.3 mL IJ SOAJ injection Inject 0.3 mg into the muscle as needed for anaphylaxis.    [provider]  lisdexamfetamine (VYVANSE) 70 MG capsule Take 1 capsule (70 mg total) by mouth every morning. 09/07/20 10/07/20  Johny Drilling, DO  sertraline (ZOLOFT) 50 MG tablet Take 1 tablet (50 mg total) by mouth daily. 09/07/20 10/07/20  Johny Drilling, DO    Family History No family history on file.  Social History Social History   Tobacco Use   Smoking status: Former    Pack years: 0.00    Types: Cigarettes    Quit date: 12/01/2019    Years since quitting: 0.8   Smokeless tobacco: Never  Vaping Use   Vaping Use: Never used  Substance Use Topics   Alcohol use: No   Drug use: Yes    Types: Marijuana    Comment: states he has not used in weeks     Allergies   Latex and Other   Review of Systems Review of Systems Pertinent negatives listed in HPI   Physical Exam  Triage Vital Signs ED Triage Vitals  Enc Vitals Group     BP 09/25/20 1033 120/74     Pulse Rate 09/25/20 1033 (!) 51     Resp 09/25/20 1033 16     Temp 09/25/20 1033 97.8 F (36.6 C)     Temp Source 09/25/20 1033 Tympanic     SpO2 09/25/20 1033 97 %     Weight --      Height --      Head Circumference --      Peak Flow --      Pain Score 09/25/20 1041 6     Pain Loc --      Pain Edu? --      Excl. in GC? --    No data found.  Updated Vital Signs BP 120/74 (BP Location: Right Arm)   Pulse (!) 51   Temp 97.8 F (36.6 C) (Tympanic)   Resp 16   SpO2 97%   Visual Acuity Right Eye Distance:   Left Eye Distance:   Bilateral Distance:    Right Eye  Near:   Left Eye Near:    Bilateral Near:     Physical Exam  General Appearance:    Alert, acutely ill appearing, cooperative, no distress  HENT:   Normocephalic, ears normal, nares mucosal edema with    congestion, rhinorrhea, oropharynx w/o erythema or exudate   Eyes:    PERRL, conjunctiva/corneas clear, EOM's intact       Lungs:     Clear to auscultation bilaterally, respirations unlabored  Heart:    Regular rate and rhythm  Neurologic:   Awake, alert, oriented x 3. No apparent focal neurological           defect.      UC Treatments / Results  Labs (all labs ordered are listed, but only abnormal results are displayed) Labs Reviewed  COVID-19, FLU A+B NAA    EKG   Radiology No results found.  Procedures Procedures (including critical care time)  Medications Ordered in UC Medications - No data to display  Initial Impression / Assessment and Plan / UC Course  I have reviewed the triage vital signs and the nursing notes.  Pertinent labs & imaging results that were available during my care of the patient were reviewed by me and considered in my medical decision making (see chart for details).      COVID/Flu test pending. Symptom management warranted only.  Manage fever with Tylenol and ibuprofen.  Nasal symptoms with over-the-counter antihistamines recommended.  Treatment per discharge medications/discharge instructions.  Red flags/ER precautions given. The most current CDC isolation/quarantine recommendation advised.   Final Clinical Impressions(s) / UC Diagnoses   Final diagnoses:  Viral URI with cough  Viral pharyngitis     Discharge Instructions      Your COVID 19 results should result within 3-5 days. Negative results are immediately resulted to Mychart. Positive results will receive a follow-up call from our clinic. If symptoms are present, I recommend home quarantine until results are known.  Alternate Tylenol and ibuprofen as needed for body aches and  fever.  Take medication as prescribed.  Symptom management per recommendations discussed today.  If any breathing difficulty or chest pain develops go immediately to the closest emergency department for evaluation.      ED Prescriptions     Medication Sig Dispense Auth. Provider   ipratropium (ATROVENT) 0.03 % nasal spray Place 2 sprays into both nostrils 2 (two) times daily. 30 mL  Bing Neighbors, FNP   levocetirizine (XYZAL) 5 MG tablet Take 1 tablet (5 mg total) by mouth every evening. 30 tablet Bing Neighbors, FNP   lidocaine (XYLOCAINE) 2 % solution Use as directed 15 mLs in the mouth or throat every 3 (three) hours as needed (Sore  throat. Mix warm water, gargle and spit). 100 mL Bing Neighbors, FNP   pseudoephedrine (SUDAFED 12 HOUR) 120 MG 12 hr tablet Take 1 tablet (120 mg total) by mouth 2 (two) times daily for 7 days. 14 tablet Bing Neighbors, FNP      PDMP not reviewed this encounter.   Bing Neighbors, FNP 09/25/20 1122

## 2020-09-27 ENCOUNTER — Telehealth: Payer: Self-pay | Admitting: Emergency Medicine

## 2020-09-27 LAB — COVID-19, FLU A+B NAA
Influenza A, NAA: NOT DETECTED
Influenza B, NAA: NOT DETECTED
SARS-CoV-2, NAA: NOT DETECTED

## 2020-09-27 MED ORDER — PSEUDOEPHEDRINE HCL ER 120 MG PO TB12: 120.0000 mg | ORAL_TABLET | Freq: Two times a day (BID) | ORAL | 0 refills | Status: AC

## 2020-09-27 MED ORDER — LIDOCAINE VISCOUS HCL 2 % MT SOLN: 15.0000 mL | OROMUCOSAL | 0 refills | Status: DC | PRN

## 2020-09-27 MED ORDER — LEVOCETIRIZINE DIHYDROCHLORIDE 5 MG PO TABS: 5.0000 mg | ORAL_TABLET | Freq: Every evening | ORAL | 0 refills | Status: DC

## 2020-09-27 MED ORDER — IPRATROPIUM BROMIDE 0.03 % NA SOLN: 2.0000 | Freq: Two times a day (BID) | NASAL | 0 refills | Status: DC

## 2020-10-05 ENCOUNTER — Ambulatory Visit: Payer: Medicaid Other | Admitting: Pediatrics

## 2020-10-05 ENCOUNTER — Telehealth: Payer: Self-pay | Admitting: Pediatrics

## 2020-10-05 DIAGNOSIS — G4709 Other insomnia: Secondary | ICD-10-CM

## 2020-10-05 DIAGNOSIS — F902 Attention-deficit hyperactivity disorder, combined type: Secondary | ICD-10-CM

## 2020-10-05 DIAGNOSIS — F411 Generalized anxiety disorder: Secondary | ICD-10-CM

## 2020-10-05 MED ORDER — CLONIDINE HCL 0.1 MG PO TABS: 0.2000 mg | ORAL_TABLET | Freq: Every evening | ORAL | 0 refills | Status: DC | PRN

## 2020-10-05 MED ORDER — AMPHETAMINE-DEXTROAMPHETAMINE 15 MG PO TABS: ORAL_TABLET | ORAL | 0 refills | Status: DC

## 2020-10-05 MED ORDER — SERTRALINE HCL 50 MG PO TABS: 50.0000 mg | ORAL_TABLET | Freq: Every day | ORAL | 0 refills | Status: DC

## 2020-10-05 MED ORDER — LISDEXAMFETAMINE DIMESYLATE 70 MG PO CAPS: 70.0000 mg | ORAL_CAPSULE | ORAL | 0 refills | Status: DC

## 2020-10-05 NOTE — Telephone Encounter (Signed)
I can squeeze him in on July 28 at 1:40 pm with his brother.  Rxs for 1 month sent: Clonidine for sleep  Adderall for the afternoon Vyvanse for the morning Zoloft

## 2020-10-05 NOTE — Telephone Encounter (Signed)
John Campbell has an appointment scheduled this afternoon to recheck his adhd but had to cancel due to a family emergency. Mom will need a refill sent to [facility]. Can you work him in sometime next week to see him?

## 2020-10-18 NOTE — Telephone Encounter (Signed)
Appointment scheduled.

## 2020-10-18 NOTE — Telephone Encounter (Signed)
[facility]

## 2020-11-03 ENCOUNTER — Ambulatory Visit (INDEPENDENT_AMBULATORY_CARE_PROVIDER_SITE_OTHER): Payer: Medicaid Other | Admitting: Pediatrics

## 2020-11-03 ENCOUNTER — Encounter: Payer: Self-pay | Admitting: Pediatrics

## 2020-11-03 ENCOUNTER — Other Ambulatory Visit: Payer: Self-pay

## 2020-11-03 VITALS — BP 123/69 | HR 66 | Ht 70.2 in | Wt 163.0 lb

## 2020-11-03 DIAGNOSIS — G4709 Other insomnia: Secondary | ICD-10-CM | POA: Diagnosis not present

## 2020-11-03 DIAGNOSIS — F411 Generalized anxiety disorder: Secondary | ICD-10-CM | POA: Diagnosis not present

## 2020-11-03 DIAGNOSIS — F902 Attention-deficit hyperactivity disorder, combined type: Secondary | ICD-10-CM | POA: Diagnosis not present

## 2020-11-03 MED ORDER — AMPHETAMINE-DEXTROAMPHETAMINE 15 MG PO TABS: ORAL_TABLET | ORAL | 0 refills | Status: DC

## 2020-11-03 MED ORDER — LISDEXAMFETAMINE DIMESYLATE 70 MG PO CAPS: 70.0000 mg | ORAL_CAPSULE | ORAL | 0 refills | Status: DC

## 2020-11-03 MED ORDER — SERTRALINE HCL 50 MG PO TABS: 50.0000 mg | ORAL_TABLET | Freq: Every day | ORAL | 2 refills | Status: DC

## 2020-11-03 MED ORDER — CLONIDINE HCL 0.1 MG PO TABS: 0.2000 mg | ORAL_TABLET | Freq: Every evening | ORAL | 2 refills | Status: DC | PRN

## 2020-11-03 NOTE — Progress Notes (Signed)
Patient Name:  John Campbell Date of Birth:  2000-09-16 Age:  20 y.o. Date of Visit:  11/03/2020  Interpreter:  none  SUBJECTIVE:  Chief Complaint  Patient presents with   ADHD  John Campbell is the primary historian.   HPI:  John Campbell is here to follow up on ADHD. His last visit was in February with Dr Georgeanne Nim.  He is currently doing well.     Grade Level in School: graduated [employer]  School: [employer] Grades: average Problems in School: none. IEP/504Plan:  none   Medication Side Effects: none Duration of Medication's Effects:  afternoon Adderall lasts until evening time.   Home life: Able to complete tasks   Behavior problems:  none Counselling: none  Sleep problems: none  Overall, his anxiety is okay.  He still gets anxious, however much improved since being on.   Panic attacks much calmer since being put on Zoloft.  He does not worry all night long.  No suicide ideations.    He has a job lined up at [employer] (12 hour shifts).  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      MEDICAL HISTORY:  Past Medical History:  Diagnosis Date   ADHD (attention deficit hyperactivity disorder), combined type    Anxiety    Other insomnia 12/09/2018    History reviewed. No pertinent family history. Outpatient Medications Prior to Visit  Medication Sig Dispense Refill   EPINEPHrine 0.3 mg/0.3 mL IJ SOAJ injection Inject 0.3 mg into the muscle as needed for anaphylaxis.     ipratropium (ATROVENT) 0.03 % nasal spray Place 2 sprays into both nostrils 2 (two) times daily. 30 mL 0   amphetamine-dextroamphetamine (ADDERALL) 15 MG tablet Take 1 tablet orally in the afternoon 30 tablet 0   cloNIDine (CATAPRES) 0.1 MG tablet Take 2 tablets (0.2 mg total) by mouth at bedtime as needed. 60 tablet 0   lisdexamfetamine (VYVANSE) 70 MG capsule Take 1 capsule (70 mg total) by mouth every morning. 30 capsule 0   sertraline (ZOLOFT) 50 MG tablet Take 1 tablet (50 mg total) by mouth daily. 30  tablet 0   lidocaine (XYLOCAINE) 2 % solution Use as directed 15 mLs in the mouth or throat every 3 (three) hours as needed (Sore  throat. Mix warm water, gargle and spit). 100 mL 0   levocetirizine (XYZAL) 5 MG tablet Take 1 tablet (5 mg total) by mouth every evening. 30 tablet 0   No facility-administered medications prior to visit.        Allergies  Allergen Reactions   Latex Hives   Other     Peaches, onions, almonds, sweet potatoes    REVIEW of SYSTEMS: Gen:  No tiredness.  No weight changes.    ENT:  No dry mouth. Cardio:  No palpitations.  No chest pain.  No diaphoresis. Resp:  No chronic cough.  No sleep apnea. GI:  No abdominal pain.  No heartburn.  No nausea. Neuro:  No headaches.  No tics  No seizures.   Derm:  No rash.  No skin discoloration. Psych:  No anxiety.  No agitation  No depression.     OBJECTIVE: BP 123/69   Pulse 66   Ht 5' 10.2" (1.783 m)   Wt 163 lb (73.9 kg)   SpO2 99%   BMI 23.26 kg/m  Wt Readings from Last 3 Encounters:  11/03/20 163 lb (73.9 kg) (62 %, Z= 0.32)*  08/09/20 172 lb 9.6 oz (78.3 kg) (75 %, Z=  0.67)*  07/09/20 181 lb (82.1 kg) (83 %, Z= 0.94)*   * Growth percentiles are based on CDC (Boys, 2-20 Years) data.    Gen:  Alert, awake, oriented and in no acute distress. Grooming:  Well-groomed Mood:  Pleasant Eye Contact:  Good Affect:  Full range ENT:  Pupils 3-4 mm, equally round and reactive to light.  Neck:  Supple.  Heart:  Regular rhythm.  No murmurs, gallops, clicks. Skin:  Well perfused.  Neuro:  No tremors.  Mental status normal.  ASSESSMENT/PLAN: 1. 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  He says he will transfer care to his father's PCP.  - lisdexamfetamine (VYVANSE) 70 MG capsule; Take 1 capsule (70 mg total) by mouth every morning.  Dispense: 30 capsule; Refill: 0 -  amphetamine-dextroamphetamine (ADDERALL) 15 MG tablet; Take 1 tablet orally in the afternoon  Dispense: 30 tablet; Refill: 0 - lisdexamfetamine (VYVANSE) 70 MG capsule; Take 1 capsule (70 mg total) by mouth every morning.  Dispense: 30 capsule; Refill: 0 - lisdexamfetamine (VYVANSE) 70 MG capsule; Take 1 capsule (70 mg total) by mouth every morning.  Dispense: 30 capsule; Refill: 0 - amphetamine-dextroamphetamine (ADDERALL) 15 MG tablet; Take 1 tablet orally in the afternoon  Dispense: 30 tablet; Refill: 0 - amphetamine-dextroamphetamine (ADDERALL) 15 MG tablet; Take 1 tablet orally in the afternoon  Dispense: 30 tablet; Refill: 0  2. Generalized anxiety disorder Advised him to discuss possible increase in Zoloft with his future PCP.  At this time, he is stable.  He may just need therapy.  - sertraline (ZOLOFT) 50 MG tablet; Take 1 tablet (50 mg total) by mouth daily.  Dispense: 30 tablet; Refill: 2  3. Other insomnia - cloNIDine (CATAPRES) 0.1 MG tablet; Take 2 tablets (0.2 mg total) by mouth at bedtime as needed.  Dispense: 60 tablet; Refill: 2    Return if symptoms worsen or fail to improve.

## 2020-12-23 ENCOUNTER — Ambulatory Visit: Payer: Medicaid Other | Admitting: Pediatrics

## 2020-12-23 DIAGNOSIS — Z00121 Encounter for routine child health examination with abnormal findings: Secondary | ICD-10-CM

## 2020-12-29 IMAGING — CT CT ABD-PELV W/ CM
2 of 4 series · 16 of 46 positions shown, 18 images · IV contrast (Omnipaque or Isovue)
Comparison: June 23, 2018

CLINICAL DATA: Right lower quadrant abdomen pain since last night.

EXAM:
CT ABDOMEN AND PELVIS WITH CONTRAST
TECHNIQUE: Multidetector CT imaging of the abdomen and pelvis was performed
using the standard protocol following bolus administration of
intravenous contrast.
CONTRAST:  100mL OMNIPAQUE IOHEXOL 300 MG/ML  SOLN

[Series 2: axial st · axial · 0.67mm/px · z∈[+903,+1328]mm · 13 of 97 slices shown, 15 images]
[im 6/97  soft-tissue]
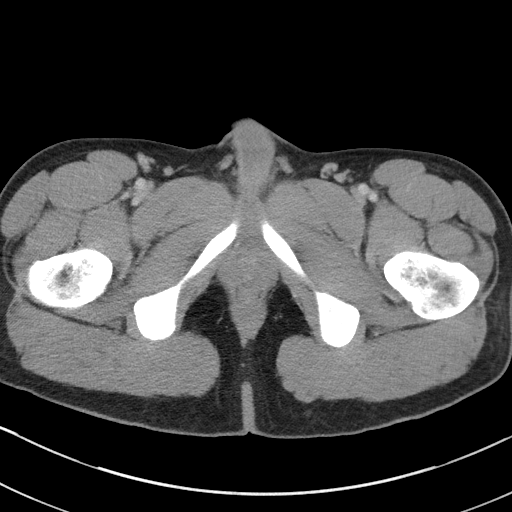
[im 6/97  bone]
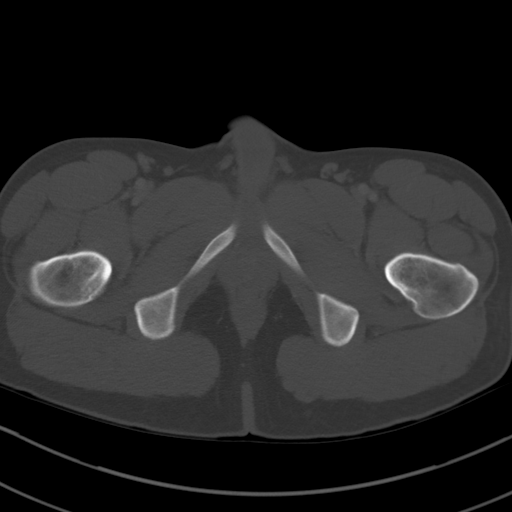
[im 12/97  soft-tissue]
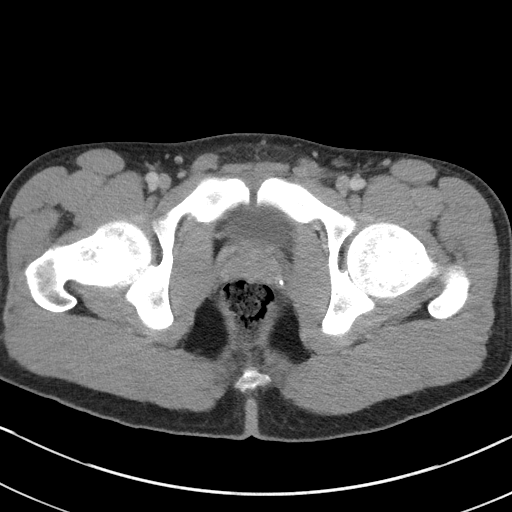
[im 23/97  soft-tissue]
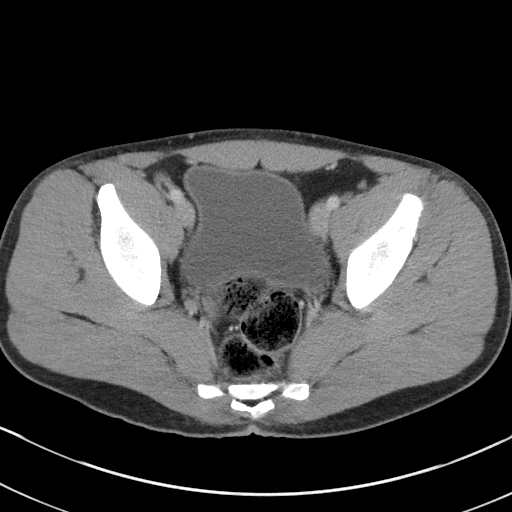
[im 29/97  soft-tissue]
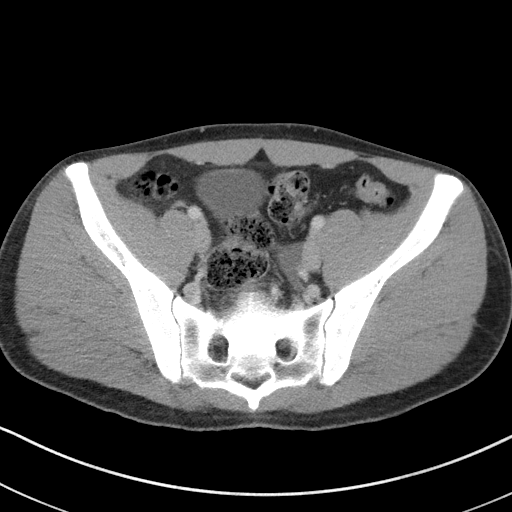
[im 34/97  soft-tissue]
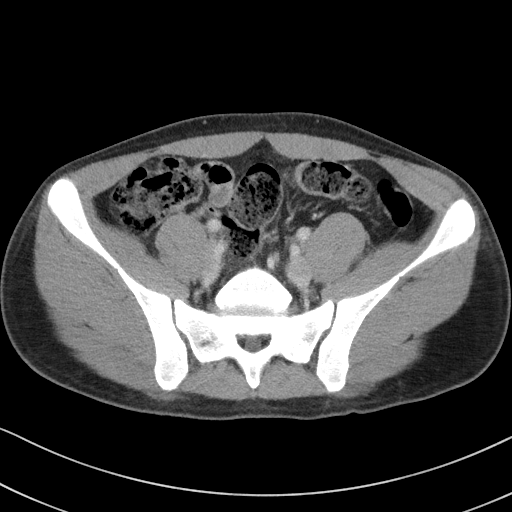
[im 40/97  soft-tissue]
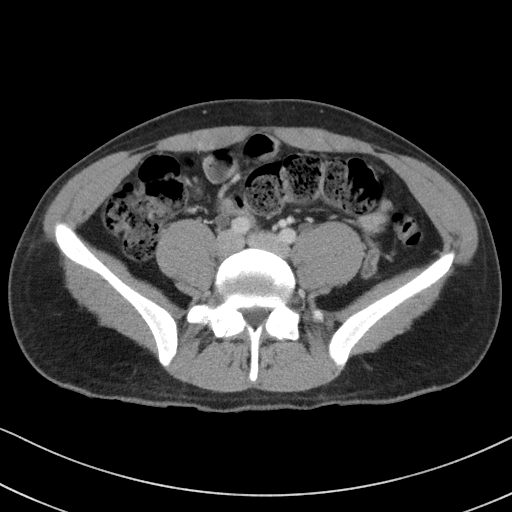
[im 51/97  soft-tissue]
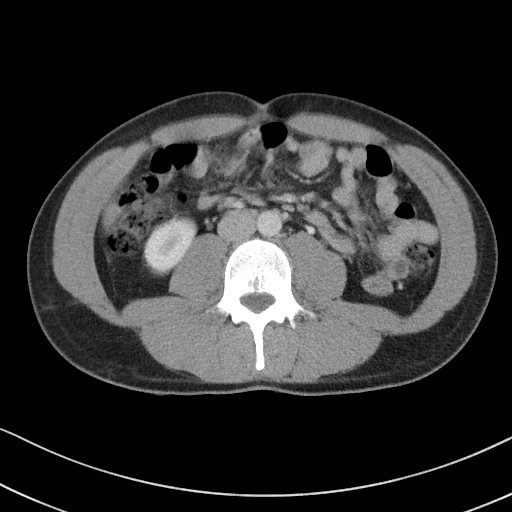
[im 57/97  soft-tissue]
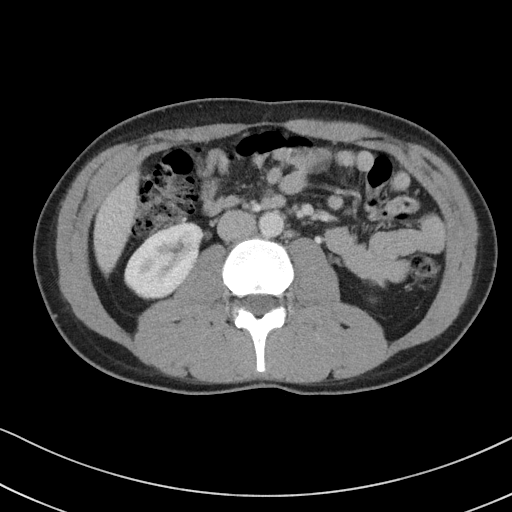
[im 63/97  soft-tissue]
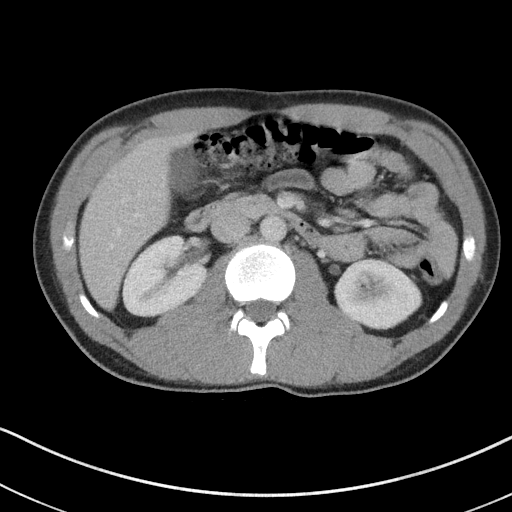
[im 63/97  bone]
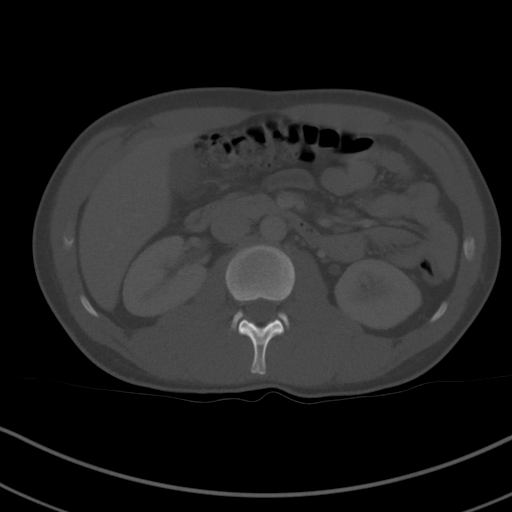
[im 68/97  soft-tissue]
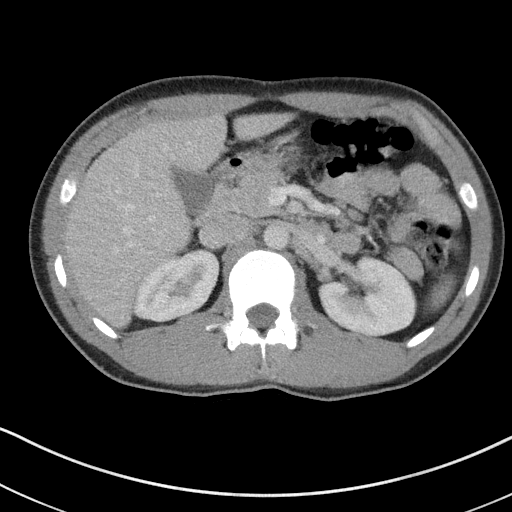
[im 74/97  soft-tissue]
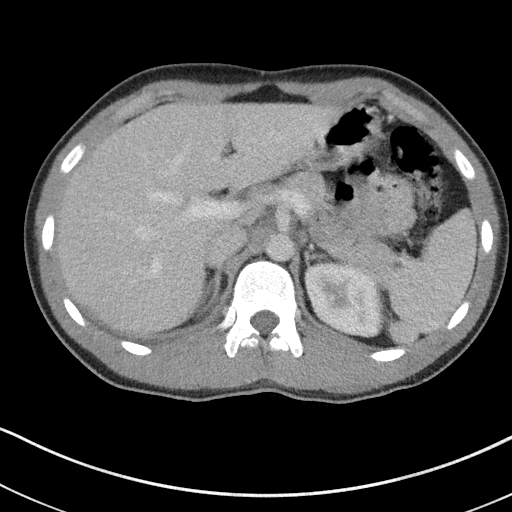
[im 85/97  soft-tissue]
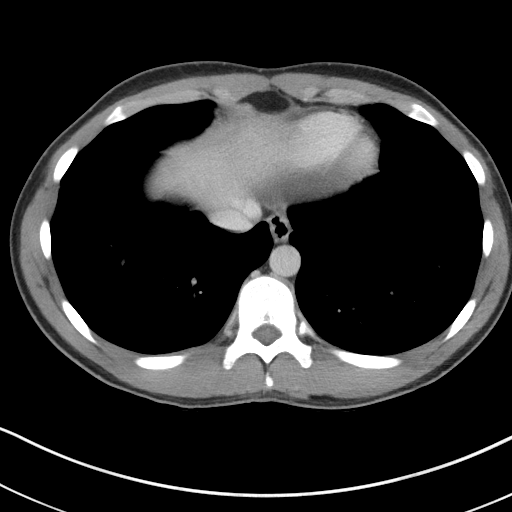
[im 91/97  soft-tissue]
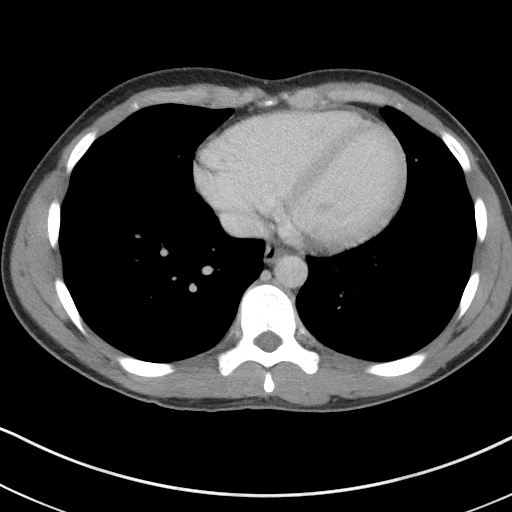

[Series 5: coronal st · coronal · 0.77mm/px · 3 of 94 slices shown]
[im 32/94  soft-tissue]
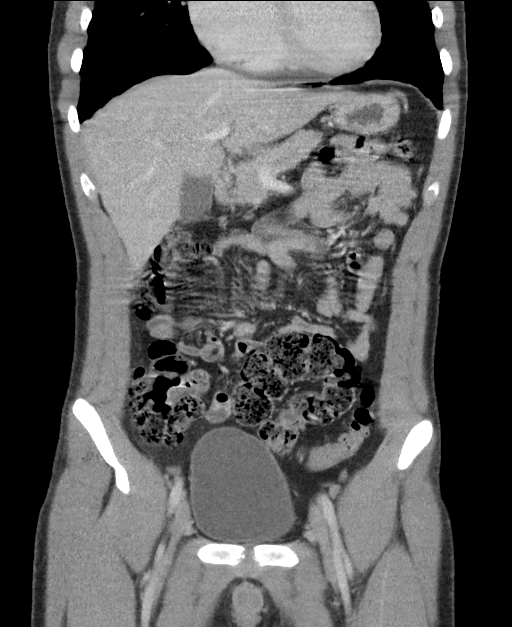
[im 42/94  soft-tissue]
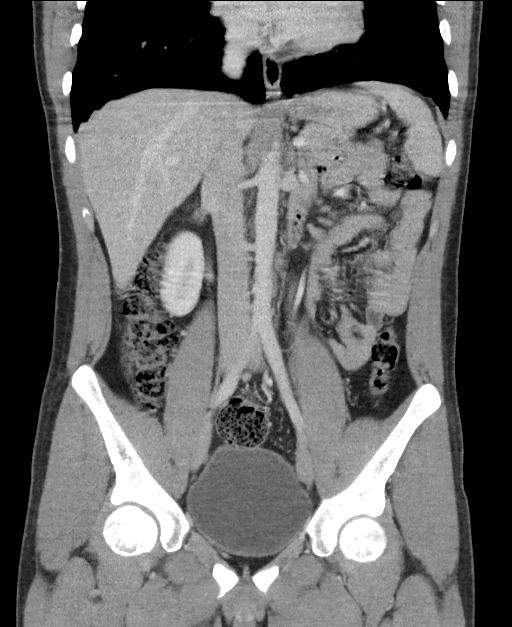
[im 52/94  soft-tissue]
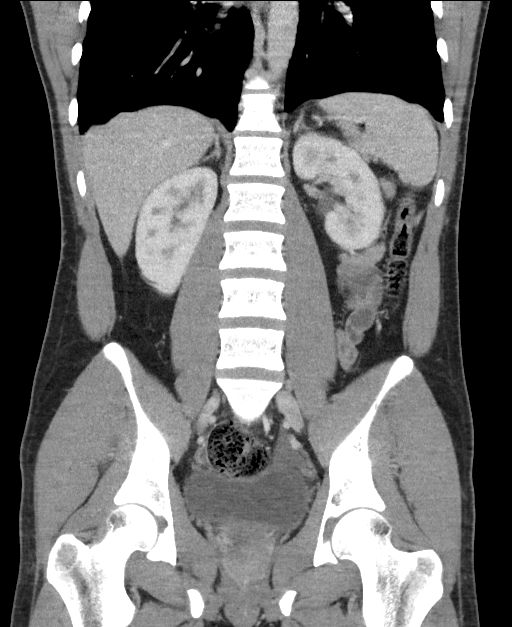

[16 of 46 positions shown; findings below may reference images not displayed]

FINDINGS: Lower chest: 1 cm peripheral nodular density of the posterior basal
segment of the right lower lobe is identified. The heart size is
normal.

Hepatobiliary: No focal liver abnormality is seen. No gallstones,
gallbladder wall thickening, or biliary dilatation.

Pancreas: Unremarkable. No pancreatic ductal dilatation or
surrounding inflammatory changes.

Spleen: Normal in size without focal abnormality.

Adrenals/Urinary Tract: Adrenal glands are unremarkable. Kidneys are
normal, without renal calculi, focal lesion, or hydronephrosis.
Bladder is unremarkable.

Stomach/Bowel: Stomach is within normal limits. Appendix appears
normal. No evidence of bowel wall thickening, distention, or
inflammatory changes. Moderate bowel content is identified
throughout colon.

Vascular/Lymphatic: No significant vascular findings are present. No
enlarged abdominal or pelvic lymph nodes.

Reproductive: Prostate is unremarkable.

Other: No abdominal wall hernia or abnormality. No abdominopelvic
ascites.

Musculoskeletal: No acute or significant osseous findings.
IMPRESSION: 1. No acute abnormality identified in the abdomen and pelvis. The
appendix is normal.
2. Moderate bowel content is identified throughout colon. This can
be seen in constipation.
3. 1 cm peripheral nodular density of the right lower lobe. Consider
further evaluation with a CT of chest on outpatient basis if the
patient has a history of smoking or other risk factor to increase
their risk of pulmonary neoplasm. If the nodule is stable at time of
repeat CT, then future CT at 18-24 months (from today's scan) is
considered optional for low-risk patients, but is recommended for
high-risk patients. This recommendation follows the consensus
statement: Guidelines for Management of Incidental Pulmonary Nodules
Detected on CT Images:From the [HOSPITAL] 3061; published
online before print ( ID /radiol. ID ).

## 2021-06-23 ENCOUNTER — Ambulatory Visit
Admission: EM | Admit: 2021-06-23 | Discharge: 2021-06-23 | Disposition: A | Discharge status: Home / Self Care | Payer: Medicaid Other | Attending: Urgent Care | Admitting: Urgent Care

## 2021-06-23 ENCOUNTER — Other Ambulatory Visit: Payer: Self-pay

## 2021-06-23 DIAGNOSIS — G8929 Other chronic pain: Secondary | ICD-10-CM

## 2021-06-23 DIAGNOSIS — K047 Periapical abscess without sinus: Secondary | ICD-10-CM | POA: Diagnosis not present

## 2021-06-23 DIAGNOSIS — K089 Disorder of teeth and supporting structures, unspecified: Secondary | ICD-10-CM

## 2021-06-23 MED ORDER — AMOXICILLIN-POT CLAVULANATE 875-125 MG PO TABS: 1.0000 | ORAL_TABLET | Freq: Two times a day (BID) | ORAL | 0 refills | Status: DC

## 2021-06-23 MED ORDER — NAPROXEN 500 MG PO TABS: 500.0000 mg | ORAL_TABLET | Freq: Two times a day (BID) | ORAL | 0 refills | Status: DC

## 2021-06-23 MED ORDER — LIDOCAINE VISCOUS HCL 2 % MT SOLN: OROMUCOSAL | 0 refills | Status: DC

## 2021-06-23 NOTE — ED Triage Notes (Signed)
Pt reports dental pain x  3 months, worse last night, was not able to sleep.  ?

## 2021-06-23 NOTE — ED Provider Notes (Signed)
?[place] ? ? ?MRN: [id number] DOB: [birth year] ? ?Subjective:  ? ?John Campbell is a 20 y.o. male presenting for 60-month history of persistent chronic dental pain.  Patient did see a dental specialist in this supposed to have a total of 6 teeth pulled.  He ended up missing his appointment.  Has not scheduled a follow-up.  He is having severe pain since yesterday of the left upper molars.  Has used topical therapy that he got over-the-counter.  He also has a leftover prescription from an antibiotic he previously took for dental infection.  He started taking but is left of it and has about 2 more pills left.  No chest pain, difficulty chewing or swallowing. ? ?Does not take any chronic medications now. ? ?Allergies  ?Allergen Reactions  ? Latex Hives  ? Other   ?  Peaches, onions, almonds, sweet potatoes  ? ? ?Past Medical History:  ?Diagnosis Date  ? ADHD (attention deficit hyperactivity disorder), combined type   ? Anxiety   ? Other insomnia 12/09/2018  ?  ? ?Past Surgical History:  ?Procedure Laterality Date  ? MOUTH SURGERY    ? ? ?History reviewed. No pertinent family history. ? ?Social History  ? ?Tobacco Use  ? Smoking status: Former  ?  Types: Cigarettes  ?  Quit date: 12/01/2019  ?  Years since quitting: 1.5  ? Smokeless tobacco: Never  ?Vaping Use  ? Vaping Use: Never used  ?Substance Use Topics  ? Alcohol use: No  ? Drug use: Yes  ?  Types: Marijuana  ?  Comment: states he has not used in weeks  ? ? ?ROS ? ? ?Objective:  ? ?Vitals: ?BP 113/75   Pulse 61   Temp 98.4 ?F (36.9 ?C) (Oral)   Resp 16   SpO2 98%  ? ?Physical Exam ?Constitutional:   ?   General: He is not in acute distress. ?   Appearance: Normal appearance. He is well-developed and normal weight. He is not ill-appearing, toxic-appearing or diaphoretic.  ?HENT:  ?   Head: Normocephalic and atraumatic.  ?   Right Ear: External ear normal.  ?   Left Ear: External ear normal.  ?   Nose: Nose normal.  ?   Mouth/Throat:  ?    Pharynx: Oropharynx is clear.  ?   Comments: Left-sided maxillary facial tenderness extending toward the lateral part of his face and upper part of his jaw.  Multiple defects along the molars both upper and lower.  There is gingival recession, erythema over the same areas.  The posterior molars of the upper right and left side all have defects. ?Eyes:  ?   General: No scleral icterus.    ?   Right eye: No discharge.     ?   Left eye: No discharge.  ?   Extraocular Movements: Extraocular movements intact.  ?Cardiovascular:  ?   Rate and Rhythm: Normal rate.  ?Pulmonary:  ?   Effort: Pulmonary effort is normal.  ?Musculoskeletal:  ?   Cervical back: Normal range of motion.  ?Neurological:  ?   Mental Status: He is alert and oriented to person, place, and time.  ?Psychiatric:     ?   Mood and Affect: Mood normal.     ?   Behavior: Behavior normal.     ?   Thought Content: Thought content normal.     ?   Judgment: Judgment normal.  ? ? ?Assessment and Plan :  ? ?PDMP  not reviewed this encounter. ? ?1. Dental infection   ?2. Chronic dental pain   ? ?Start Augmentin for dental infection/abscess, use naproxen for pain and inflammation.  Provided with a prescription for viscous lidocaine for topical pain relief.  Emphasized need for dental surgeon consult. Counseled patient on potential for adverse effects with medications prescribed/recommended today, strict ER and return-to-clinic precautions discussed, patient verbalized understanding. ?  ?  ?Jaynee Eagles, PA-C ?06/23/21 1240 ? ?

## 2021-06-23 NOTE — Discharge Instructions (Addendum)
Make sure you schedule an appointment with a dentist/dental surgeon as soon as possible.  You may try some of the resources below.    [facility] in [place], [place] Address: [address], [place], [place] [address] Phone: [phone]  [facility] [phone] extension [phone]  Dr. Civils [phone] [address]  [facility] [phone] [address]  [facility] [phone] extension [phone] [place], , [address] First come first serve for the first 10 clients.  May do simple extractions only, no wisdom teeth or surgery.  You may try the second for Thursday of the month starting at 6:30 AM.  [employer] You may call the school to see if they are still helping to provide dental care for emergent cases.  

## 2021-07-25 ENCOUNTER — Emergency Department (HOSPITAL_COMMUNITY)
Admission: EM | Admit: 2021-07-25 | Discharge: 2021-07-25 | Disposition: A | Discharge status: Home / Self Care | Payer: Medicaid Other | Attending: Emergency Medicine | Admitting: Emergency Medicine

## 2021-07-25 ENCOUNTER — Other Ambulatory Visit: Payer: Self-pay

## 2021-07-25 ENCOUNTER — Encounter (HOSPITAL_COMMUNITY): Payer: Self-pay | Admitting: *Deleted

## 2021-07-25 DIAGNOSIS — Z91040 Latex allergy status: Secondary | ICD-10-CM | POA: Diagnosis not present

## 2021-07-25 DIAGNOSIS — K0889 Other specified disorders of teeth and supporting structures: Secondary | ICD-10-CM | POA: Diagnosis present

## 2021-07-25 MED ORDER — OXYCODONE-ACETAMINOPHEN 5-325 MG PO TABS: 1.0000 | ORAL_TABLET | Freq: Four times a day (QID) | ORAL | 0 refills | Status: DC | PRN

## 2021-07-25 MED ORDER — OXYCODONE-ACETAMINOPHEN 5-325 MG PO TABS
1.0000 | ORAL_TABLET | Freq: Once | ORAL | Status: AC
  Administered 2021-07-25: 1 via ORAL
  Filled 2021-07-25: qty 1

## 2021-07-25 NOTE — ED Provider Notes (Addendum)
?[place] ?Provider Note ? ? ?CSN: [id number] ?Arrival date & time: 07/25/21  1648 ? ?  ? ?History ? ?Chief Complaint  ?Patient presents with  ? Dental Pain  ? ? ?John Campbell is a 21 y.o. male. ? ?Patient states that he had 7 teeth removed today.  Patient was given Motrin for pain.  Patient states Motrin is not working patient has no other medical problem ? ?The history is provided by a relative and the patient. No language interpreter was used.  ?Dental Pain ?Location:  Upper and lower ?Quality:  Aching ?Severity:  Moderate ?Onset quality:  Sudden ?Timing:  Constant ?Progression:  Worsening ?Chronicity:  New ?Context: not abscess   ?Prior workup: Pulling of 7 teeth. ?Relieved by:  Nothing ?Associated symptoms: no congestion and no headaches   ? ?  ? ?Home Medications ?Prior to Admission medications   ?Medication Sig Start Date End Date Taking? Authorizing Provider  ?oxyCODONE-acetaminophen (PERCOCET/ROXICET) 5-325 MG tablet Take 1 tablet by mouth every 6 (six) hours as needed for severe pain. 07/25/21  Yes Milton Ferguson, MD  ?amoxicillin-clavulanate (AUGMENTIN) 875-125 MG tablet Take 1 tablet by mouth 2 (two) times daily. 06/23/21   Jaynee Eagles, PA-C  ?amphetamine-dextroamphetamine (ADDERALL) 15 MG tablet Take 1 tablet orally in the afternoon 11/03/20 12/03/20  Iven Finn, DO  ?amphetamine-dextroamphetamine (ADDERALL) 15 MG tablet Take 1 tablet orally in the afternoon 12/02/20 12/03/20  Iven Finn, DO  ?amphetamine-dextroamphetamine (ADDERALL) 15 MG tablet Take 1 tablet orally in the afternoon 12/30/20 01/29/21  Iven Finn, DO  ?cloNIDine (CATAPRES) 0.1 MG tablet Take 2 tablets (0.2 mg total) by mouth at bedtime as needed. 11/03/20 02/01/21  Iven Finn, DO  ?EPINEPHrine 0.3 mg/0.3 mL IJ SOAJ injection Inject 0.3 mg into the muscle as needed for anaphylaxis.    [provider]  ?ipratropium (ATROVENT) 0.03 % nasal spray Place 2 sprays into both nostrils 2 (two) times  daily. 09/27/20   Scot Jun, FNP  ?lidocaine (XYLOCAINE) 2 % solution Apply pea sized amount to the area of pain 3 times daily as needed. 06/23/21   Jaynee Eagles, PA-C  ?lisdexamfetamine (VYVANSE) 70 MG capsule Take 1 capsule (70 mg total) by mouth every morning. 11/03/20 12/03/20  Iven Finn, DO  ?lisdexamfetamine (VYVANSE) 70 MG capsule Take 1 capsule (70 mg total) by mouth every morning. 12/02/20 01/01/21  Iven Finn, DO  ?lisdexamfetamine (VYVANSE) 70 MG capsule Take 1 capsule (70 mg total) by mouth every morning. 12/30/20 01/29/21  Iven Finn, DO  ?naproxen (NAPROSYN) 500 MG tablet Take 1 tablet (500 mg total) by mouth 2 (two) times daily with a meal. 06/23/21   Jaynee Eagles, PA-C  ?sertraline (ZOLOFT) 50 MG tablet Take 1 tablet (50 mg total) by mouth daily. 11/03/20 12/03/20  Iven Finn, DO  ?   ? ?Allergies    ?Latex and Other   ? ?Review of Systems   ?Review of Systems  ?Constitutional:  Negative for appetite change and fatigue.  ?HENT:  Positive for dental problem. Negative for congestion, ear discharge and sinus pressure.   ?Eyes:  Negative for discharge.  ?Respiratory:  Negative for cough.   ?Cardiovascular:  Negative for chest pain.  ?Gastrointestinal:  Negative for abdominal pain and diarrhea.  ?Genitourinary:  Negative for frequency and hematuria.  ?Musculoskeletal:  Negative for back pain.  ?Skin:  Negative for rash.  ?Neurological:  Negative for seizures and headaches.  ?Psychiatric/Behavioral:  Negative for hallucinations.   ? ?Physical Exam ?Updated Vital Signs ?BP 132/89  Pulse 60   Temp 97.9 ?F (36.6 ?C) (Oral)   Resp 18   Ht 5\' 10"  (1.778 m)   Wt 68 kg   SpO2 100%   BMI 21.52 kg/m?  ?Physical Exam ?Vitals and nursing note reviewed.  ?Constitutional:   ?   Appearance: He is well-developed.  ?HENT:  ?   Head: Normocephalic.  ?   Mouth/Throat:  ?   Comments: Male shows the 7 teeth areas that were pulled.  All areas seems to be healing properly ?Eyes:  ?   General: No  scleral icterus. ?   Conjunctiva/sclera: Conjunctivae normal.  ?Neck:  ?   Thyroid: No thyromegaly.  ?Cardiovascular:  ?   Rate and Rhythm: Normal rate and regular rhythm.  ?   Heart sounds: No murmur heard. ?  No friction rub. No gallop.  ?Pulmonary:  ?   Breath sounds: No stridor. No wheezing or rales.  ?Chest:  ?   Chest wall: No tenderness.  ?Abdominal:  ?   General: There is no distension.  ?   Tenderness: There is no abdominal tenderness. There is no rebound.  ?Musculoskeletal:     ?   General: Normal range of motion.  ?   Cervical back: Neck supple.  ?Lymphadenopathy:  ?   Cervical: No cervical adenopathy.  ?Skin: ?   Findings: No erythema or rash.  ?Neurological:  ?   Mental Status: He is alert and oriented to person, place, and time.  ?   Motor: No abnormal muscle tone.  ?   Coordination: Coordination normal.  ?Psychiatric:     ?   Behavior: Behavior normal.  ? ? ?ED Results / Procedures / Treatments   ?Labs ?(all labs ordered are listed, but only abnormal results are displayed) ?Labs Reviewed - No data to display ? ?EKG ?None ? ?Radiology ?No results found. ? ?Procedures ?Procedures  ? ? ?Medications Ordered in ED ?Medications  ?oxyCODONE-acetaminophen (PERCOCET/ROXICET) 5-325 MG per tablet 1 tablet (has no administration in time range)  ? ? ?ED Course/ Medical Decision Making/ A&P ?  ?                        ?Medical Decision Making ?Risk ?Prescription drug management. ? ?This patient presents to the ED for concern of mouth pain, this involves an extensive number of treatment options, and is a complaint that carries with it a high risk of complications and morbidity.  The differential diagnosis includes infection, posttrauma pain from pulling teeth ? ? ?Co morbidities that complicate the patient evaluation ? ?Poor dentition ? ? ?Additional history obtained: ? ?Additional history obtained from relative ?External records from outside source obtained and reviewed including hospital records ? ? ?Lab  Tests: ? ?No labs ? ?Imaging Studies ordered: ? ?No imaging ? ?Cardiac Monitoring: / EKG: ? ?The patient was maintained on a cardiac monitor.  I personally viewed and interpreted the cardiac monitored which showed an underlying rhythm of: Normal sinus rhythm ? ? ?Consultations Obtained: ? ?No consult ? ?Problem List / ED Course / Critical interventions / Medication management ? ?Mouth pain ?I ordered medication including Percocet for pain ?Reevaluation of the patient after these medicines showed that the patient improved ?I have reviewed the patients home medicines and have made adjustments as needed ? ? ?Social Determinants of Health: ? ?None ? ? ?Test / Admission - Considered: ? ?none ? ?Patient with pain in his mouth secondary to removal of 7 teeth today.  He is given some Percocets and will follow-up with his dentist ? ? ? ? ? ? ? ?Final Clinical Impression(s) / ED Diagnoses ?Final diagnoses:  ?Pain, dental  ? ? ?Rx / DC Orders ?ED Discharge Orders   ? ?      Ordered  ?  oxyCODONE-acetaminophen (PERCOCET/ROXICET) 5-325 MG tablet  Every 6 hours PRN       ? 07/25/21 1729  ? ?  ?  ? ?  ? ? ?  ?Milton Ferguson, MD ?07/26/21 1141 ? ?  ?Milton Ferguson, MD ?07/26/21 1142 ? ?

## 2021-07-25 NOTE — ED Triage Notes (Signed)
Pt had 7 teeth pulled this am and was only given ibuprofen to go home with; pt having pain with minimal bleeding ?

## 2021-07-25 NOTE — Discharge Instructions (Signed)
Follow-up with a dentist if needed ?

## 2021-07-30 ENCOUNTER — Other Ambulatory Visit: Payer: Self-pay

## 2021-07-30 ENCOUNTER — Encounter (HOSPITAL_COMMUNITY): Payer: Self-pay

## 2021-07-30 ENCOUNTER — Emergency Department (HOSPITAL_COMMUNITY)
Admission: EM | Admit: 2021-07-30 | Discharge: 2021-07-30 | Disposition: A | Discharge status: Home / Self Care | Payer: Medicaid Other | Attending: Emergency Medicine | Admitting: Emergency Medicine

## 2021-07-30 DIAGNOSIS — Z91040 Latex allergy status: Secondary | ICD-10-CM | POA: Insufficient documentation

## 2021-07-30 DIAGNOSIS — K0889 Other specified disorders of teeth and supporting structures: Secondary | ICD-10-CM | POA: Insufficient documentation

## 2021-07-30 MED ORDER — IBUPROFEN 600 MG PO TABS: 600.0000 mg | ORAL_TABLET | Freq: Four times a day (QID) | ORAL | 0 refills | Status: DC | PRN

## 2021-07-30 MED ORDER — HYDROCODONE-ACETAMINOPHEN 5-325 MG PO TABS
1.0000 | ORAL_TABLET | Freq: Once | ORAL | Status: AC
  Administered 2021-07-30: 1 via ORAL
  Filled 2021-07-30: qty 1

## 2021-07-30 NOTE — Discharge Instructions (Addendum)
Follow-up with your dentist/oral surgeon first thing in the morning tomorrow as discussed for reevaluation and continued medical management. ? ?Continue your conservative measures such as rest, saline rinses, ice packs as needed, and soft foods.  As discussed do not use a straw.  You may continue to manage pain with ibuprofen and Tylenol, alternating them both.  A stronger dose of ibuprofen has been sent to your pharmacy which you may use instead of the 200 mg dose you were provided.  You may take 1 pill every 6-8 hours as needed for pain relief.  Always take with food. ? ?Return to the ED for new or worsening symptoms as discussed. ?

## 2021-07-30 NOTE — ED Provider Notes (Addendum)
?[place] EMERGENCY DEPARTMENT ?Provider Note ? ? ?CSN: [id number] ?Arrival date & time: 07/30/21  1953 ? ?  ? ?History ? ?Chief Complaint  ?Patient presents with  ? Dental Pain  ? ? ?John Campbell is a 21 y.o. male with chief complaint of dental pain.  Had 7 molars removed on 07/25/2021.  Is concerned that he might have an infection due to increasing pain.  Is currently on amoxicillin.  Denies fever, tongue swelling, throat pain, shortness of breath, chest pain, neck pain.  Endorses mild accompanying headache.  States he is still currently taking the ibuprofen, without much relief.  Has not yet followed up postoperatively with oral surgeon.  His father drove him to the ED.  Without any other medical complaints at this time. ? ?The history is provided by the patient and medical records.  ?Dental Pain ? ?  ? ?Home Medications ?Prior to Admission medications   ?Medication Sig Start Date End Date Taking? Authorizing Provider  ?ibuprofen (ADVIL) 600 MG tablet Take 1 tablet (600 mg total) by mouth every 6 (six) hours as needed. 07/30/21  Yes Cecil Cobbs, PA-C  ?amoxicillin-clavulanate (AUGMENTIN) 875-125 MG tablet Take 1 tablet by mouth 2 (two) times daily. 06/23/21   Wallis Bamberg, PA-C  ?amphetamine-dextroamphetamine (ADDERALL) 15 MG tablet Take 1 tablet orally in the afternoon 11/03/20 12/03/20  Johny Drilling, DO  ?amphetamine-dextroamphetamine (ADDERALL) 15 MG tablet Take 1 tablet orally in the afternoon 12/02/20 12/03/20  Johny Drilling, DO  ?amphetamine-dextroamphetamine (ADDERALL) 15 MG tablet Take 1 tablet orally in the afternoon 12/30/20 01/29/21  Johny Drilling, DO  ?cloNIDine (CATAPRES) 0.1 MG tablet Take 2 tablets (0.2 mg total) by mouth at bedtime as needed. 11/03/20 02/01/21  Johny Drilling, DO  ?EPINEPHrine 0.3 mg/0.3 mL IJ SOAJ injection Inject 0.3 mg into the muscle as needed for anaphylaxis.    [provider]  ?ipratropium (ATROVENT) 0.03 % nasal spray Place 2 sprays into both nostrils 2  (two) times daily. 09/27/20   Bing Neighbors, FNP  ?lidocaine (XYLOCAINE) 2 % solution Apply pea sized amount to the area of pain 3 times daily as needed. 06/23/21   Wallis Bamberg, PA-C  ?lisdexamfetamine (VYVANSE) 70 MG capsule Take 1 capsule (70 mg total) by mouth every morning. 11/03/20 12/03/20  Johny Drilling, DO  ?lisdexamfetamine (VYVANSE) 70 MG capsule Take 1 capsule (70 mg total) by mouth every morning. 12/02/20 01/01/21  Johny Drilling, DO  ?lisdexamfetamine (VYVANSE) 70 MG capsule Take 1 capsule (70 mg total) by mouth every morning. 12/30/20 01/29/21  Johny Drilling, DO  ?naproxen (NAPROSYN) 500 MG tablet Take 1 tablet (500 mg total) by mouth 2 (two) times daily with a meal. 06/23/21   Wallis Bamberg, PA-C  ?oxyCODONE-acetaminophen (PERCOCET/ROXICET) 5-325 MG tablet Take 1 tablet by mouth every 6 (six) hours as needed for severe pain. 07/25/21   Bethann Berkshire, MD  ?sertraline (ZOLOFT) 50 MG tablet Take 1 tablet (50 mg total) by mouth daily. 11/03/20 12/03/20  Johny Drilling, DO  ?   ? ?Allergies    ?Latex and Other   ? ?Review of Systems   ?Review of Systems  ?HENT:  Positive for dental problem.   ? ?Physical Exam ?Updated Vital Signs ?BP (!) 117/91 (BP Location: Right Arm)   Pulse 99   Temp 98.3 ?F (36.8 ?C) (Oral)   Resp 18   Ht 5\' 10"  (1.778 m)   Wt 69 kg   SpO2 98%   BMI 21.83 kg/m?  ?Physical Exam ?Vitals and nursing note  reviewed.  ?Constitutional:   ?   General: He is not in acute distress. ?   Appearance: He is well-developed.  ?HENT:  ?   Head: Normocephalic and atraumatic.  ?   Comments: Full ROM and mild tenderness of the right and left jaw, which correlates clinically to where the tooth removed ?Sockets appear to be healing without discharge, erythema, or evidence of infection ?Without uvula deviation ?Able to swallow without difficulty ?No evidence of stridor, drooling, tongue protrusion, trismus, edema of upper midline neck and/or floor of mouth, or raised tongue ?Eyes:  ?    Conjunctiva/sclera: Conjunctivae normal.  ?Cardiovascular:  ?   Rate and Rhythm: Normal rate and regular rhythm.  ?   Pulses: Normal pulses.  ?   Heart sounds: Normal heart sounds. No murmur heard. ?   Comments: Heart rate 90 bpm on exam ?Pulmonary:  ?   Effort: Pulmonary effort is normal. No respiratory distress.  ?   Breath sounds: Normal breath sounds.  ?Abdominal:  ?   Palpations: Abdomen is soft.  ?   Tenderness: There is no abdominal tenderness.  ?Musculoskeletal:     ?   General: No swelling.  ?   Cervical back: Neck supple.  ?Skin: ?   General: Skin is warm and dry.  ?   Capillary Refill: Capillary refill takes less than 2 seconds.  ?Neurological:  ?   Mental Status: He is alert and oriented to person, place, and time.  ?Psychiatric:     ?   Mood and Affect: Mood normal.  ? ? ?ED Results / Procedures / Treatments   ?Labs ?(all labs ordered are listed, but only abnormal results are displayed) ?Labs Reviewed - No data to display ? ?EKG ?None ? ?Radiology ?No results found. ? ?Procedures ?Procedures  ? ? ?Medications Ordered in ED ?Medications  ?HYDROcodone-acetaminophen (NORCO/VICODIN) 5-325 MG per tablet 1 tablet (has no administration in time range)  ? ? ?ED Course/ Medical Decision Making/ A&P ?  ?                        ?Medical Decision Making ?Amount and/or Complexity of Data Reviewed ?External Data Reviewed: notes. ?Labs:  Decision-making details documented in ED Course. ?Radiology:  Decision-making details documented in ED Course. ?ECG/medicine tests:  Decision-making details documented in ED Course. ? ?Risk ?OTC drugs. ?Prescription drug management. ? ? ?21 y.o. male presents to the ED for concern of Dental Pain ? Marland Kitchen  This involves an extensive number of treatment options, and is a complaint that carries with it a high risk of complications and morbidity.  The emergent differential diagnosis prior to evaluation includes, but is not limited to: Postoperative tenderness, Ludwig's angina, sepsis ? ?This  is not an exhaustive differential.  ? ?Past Medical History / Co-morbidities / Social History: ?Hx of ADHD, insomnia, generalized anxiety, recent oral surgery of 7 teeth extraction ? ?Additional History:  ?Internal and external records from outside source obtained and reviewed including ED visits, urgent care, pediatrics ? ?Physical Exam: ?Physical exam performed. The pertinent findings include: Overall well-appearing.  Tenderness of the jaw that correlates to recent oral surgery of multiple teeth extraction.  Without evidence of infection.  No discharge or erythema on the inside or outside of the mouth/jaw.  Sockets without evidence of infection.  Oropharynx overall appears normal.  Afebrile.  Not tachycardic.  No new cardiac murmurs.  Without chest pain or shortness of breath.  Communicating well without difficulty.  Lungs CTAB without transmitted upper airway sounds.  Without increased work of breathing.  No neck stiffness or torticollis.  Not cyanotic.  Almost full range of motion of jaw.  No trismus.  Able to swallow without difficulty. ? ?Lab Tests: ?None ? ?Imaging Studies: ?None ? ?Medications: ?I ordered medication including single dose of Vicodin for significant dental pain following multiple extractions.  Reevaluation of the patient after these medicines showed that the patient tolerated this well and had mild improvement.  I have reviewed the patients home medicines and have made adjustments as needed ? ?ED Course/Disposition: ?Pt well-appearing on exam.  Patient with continuing and mildly worsening pain following recent surgery of 7 teeth extraction.  No gross abscess.  Sockets of mouth appear to be healing well and without complications.  Patient currently on amoxicillin.  Exam unconcerning for Ludwig's angina or spread of infection.  Not suspicious of sepsis.  Patient currently on penicillin, which he was encouraged to continue.  Pain managed in the ED.  Encourage continuing outpatient conservative  measures.  Urged patient to follow-up with dentist/oral surgeon first thing tomorrow morning.   ? ?I reevaluated the patient following the interventions noted above, and found that they have tolerated this well and modera

## 2021-07-30 NOTE — ED Triage Notes (Signed)
Pt had 7 teeth pulled last Tuesday and believes they may be infected. States he has a bad taste in his mouth that wont go away and feels like the left side of his face is swollen. Pt is currently taking amoxicillin but does not have any pain medication left. ?

## 2021-09-23 ENCOUNTER — Other Ambulatory Visit: Payer: Self-pay

## 2021-09-23 ENCOUNTER — Emergency Department (HOSPITAL_COMMUNITY)
Admission: EM | Admit: 2021-09-23 | Discharge: 2021-09-23 | Disposition: A | Discharge status: Home / Self Care | Payer: Medicaid Other | Attending: Emergency Medicine | Admitting: Emergency Medicine

## 2021-09-23 ENCOUNTER — Encounter (HOSPITAL_COMMUNITY): Payer: Self-pay | Admitting: Emergency Medicine

## 2021-09-23 DIAGNOSIS — R519 Headache, unspecified: Secondary | ICD-10-CM | POA: Insufficient documentation

## 2021-09-23 DIAGNOSIS — Z91040 Latex allergy status: Secondary | ICD-10-CM | POA: Insufficient documentation

## 2021-09-23 DIAGNOSIS — R509 Fever, unspecified: Secondary | ICD-10-CM | POA: Diagnosis not present

## 2021-09-23 DIAGNOSIS — R1030 Lower abdominal pain, unspecified: Secondary | ICD-10-CM | POA: Insufficient documentation

## 2021-09-23 LAB — CBC WITH DIFFERENTIAL/PLATELET
Abs Immature Granulocytes: 0.04 10*3/uL (ref 0.00–0.07)
Basophils Absolute: 0.1 10*3/uL (ref 0.0–0.1)
Basophils Relative: 1 %
Eosinophils Absolute: 0.2 10*3/uL (ref 0.0–0.5)
Eosinophils Relative: 2 %
HCT: 45.7 % (ref 39.0–52.0)
Hemoglobin: 15.6 g/dL (ref 13.0–17.0)
Immature Granulocytes: 0 %
Lymphocytes Relative: 20 %
Lymphs Abs: 2 10*3/uL (ref 0.7–4.0)
MCH: 29.7 pg (ref 26.0–34.0)
MCHC: 34.1 g/dL (ref 30.0–36.0)
MCV: 86.9 fL (ref 80.0–100.0)
Monocytes Absolute: 0.9 10*3/uL (ref 0.1–1.0)
Monocytes Relative: 9 %
Neutro Abs: 6.9 10*3/uL (ref 1.7–7.7)
Neutrophils Relative %: 68 %
Platelets: 192 10*3/uL (ref 150–400)
RBC: 5.26 MIL/uL (ref 4.22–5.81)
RDW: 12.3 % (ref 11.5–15.5)
WBC: 10 10*3/uL (ref 4.0–10.5)
nRBC: 0 % (ref 0.0–0.2)

## 2021-09-23 LAB — URINALYSIS, ROUTINE W REFLEX MICROSCOPIC
Bilirubin Urine: NEGATIVE
Glucose, UA: NEGATIVE mg/dL
Hgb urine dipstick: NEGATIVE
Ketones, ur: NEGATIVE mg/dL
Leukocytes,Ua: NEGATIVE
Nitrite: NEGATIVE
Protein, ur: NEGATIVE mg/dL
Specific Gravity, Urine: 1.004 — ABNORMAL LOW (ref 1.005–1.030)
pH: 6 (ref 5.0–8.0)

## 2021-09-23 LAB — BASIC METABOLIC PANEL
Anion gap: 6 (ref 5–15)
BUN: 13 mg/dL (ref 6–20)
CO2: 26 mmol/L (ref 22–32)
Calcium: 9.1 mg/dL (ref 8.9–10.3)
Chloride: 104 mmol/L (ref 98–111)
Creatinine, Ser: 0.95 mg/dL (ref 0.61–1.24)
GFR, Estimated: >60 mL/min (ref >60)
Glucose, Bld: 80 mg/dL (ref 70–99)
Potassium: 3.5 mmol/L (ref 3.5–5.1)
Sodium: 136 mmol/L (ref 135–145)

## 2021-09-23 MED ORDER — SODIUM CHLORIDE 0.9 % IV BOLUS
1000.0000 mL | Freq: Once | INTRAVENOUS | Status: AC
  Administered 2021-09-23: 1000 mL via INTRAVENOUS

## 2021-09-23 MED ORDER — KETOROLAC TROMETHAMINE 15 MG/ML IJ SOLN
15.0000 mg | Freq: Once | INTRAMUSCULAR | Status: AC
  Administered 2021-09-23: 15 mg via INTRAVENOUS
  Filled 2021-09-23: qty 1

## 2021-09-23 MED ORDER — ONDANSETRON HCL 4 MG/2ML IJ SOLN
4.0000 mg | Freq: Once | INTRAMUSCULAR | Status: AC
  Administered 2021-09-23: 4 mg via INTRAVENOUS
  Filled 2021-09-23: qty 2

## 2021-09-23 MED ORDER — ONDANSETRON HCL 4 MG PO TABS: 4.0000 mg | ORAL_TABLET | Freq: Three times a day (TID) | ORAL | 0 refills | Status: DC | PRN

## 2021-09-23 NOTE — ED Notes (Signed)
Dc instructions and scripts reviewed with pt no questions or concerns at this time. Will follow up as needed ?

## 2021-09-23 NOTE — ED Triage Notes (Signed)
Pt to the ED with abdominal pain that began after eating pizza last night. The pt states he vomited last night, but that was the last time.

## 2021-09-23 NOTE — ED Provider Notes (Signed)
[facility] EMERGENCY DEPARTMENT Provider Note   CSN: [id number] Arrival date & time: 09/23/21  1025     History  Chief Complaint  Patient presents with   Abdominal Pain    John Campbell is a 21 y.o. male.   Abdominal Pain Patient presents abdominal pain.  Began this morning.  States last night he felt bad and had a headache.  States he was aching and felt as if he could have had a fever.  Also vomited last night.  Now more lower abdominal pain.  Has not vomited.  States it may burn a little when he urinates.  States had a bowel movement today and may be made the pain worse.  No sick contacts.  Denies likely STD.     Home Medications Prior to Admission medications   Medication Sig Start Date End Date Taking? Authorizing Provider  EPINEPHrine 0.3 mg/0.3 mL IJ SOAJ injection Inject 0.3 mg into the muscle as needed for anaphylaxis.   Yes [provider]  ondansetron (ZOFRAN) 4 MG tablet Take 1 tablet (4 mg total) by mouth every 8 (eight) hours as needed for nausea or vomiting. 09/23/21  Yes Benjiman Core, MD  amoxicillin-clavulanate (AUGMENTIN) 875-125 MG tablet Take 1 tablet by mouth 2 (two) times daily. Patient not taking: Reported on 09/23/2021 06/23/21   Wallis Bamberg, PA-C  ibuprofen (ADVIL) 600 MG tablet Take 1 tablet (600 mg total) by mouth every 6 (six) hours as needed. Patient not taking: Reported on 09/23/2021 07/30/21   Cecil Cobbs, PA-C  ipratropium (ATROVENT) 0.03 % nasal spray Place 2 sprays into both nostrils 2 (two) times daily. Patient not taking: Reported on 09/23/2021 09/27/20   Bing Neighbors, FNP  naproxen (NAPROSYN) 500 MG tablet Take 1 tablet (500 mg total) by mouth 2 (two) times daily with a meal. Patient not taking: Reported on 09/23/2021 06/23/21   Wallis Bamberg, PA-C  oxyCODONE-acetaminophen (PERCOCET/ROXICET) 5-325 MG tablet Take 1 tablet by mouth every 6 (six) hours as needed for severe pain. Patient not taking: Reported on 09/23/2021 07/25/21    Bethann Berkshire, MD      Allergies    Latex and Other    Review of Systems   Review of Systems  Gastrointestinal:  Positive for abdominal pain.    Physical Exam Updated Vital Signs BP 118/74   Pulse (!) 50   Temp 97.8 F (36.6 C) (Oral)   Resp 18   Ht 5\' 10"  (1.778 m)   Wt 69 kg   SpO2 100%   BMI 21.83 kg/m  Physical Exam  ED Results / Procedures / Treatments   Labs (all labs ordered are listed, but only abnormal results are displayed) Labs Reviewed  URINALYSIS, ROUTINE W REFLEX MICROSCOPIC - Abnormal; Notable for the following components:      Result Value   Specific Gravity, Urine 1.004 (*)    All other components within normal limits  CBC WITH DIFFERENTIAL/PLATELET  BASIC METABOLIC PANEL    EKG None  Radiology No results found.  Procedures Procedures    Medications Ordered in ED Medications  sodium chloride 0.9 % bolus 1,000 mL (0 mLs Intravenous Stopped 09/23/21 1236)  ondansetron (ZOFRAN) injection 4 mg (4 mg Intravenous Given 09/23/21 1116)  ketorolac (TORADOL) 15 MG/ML injection 15 mg (15 mg Intravenous Given 09/23/21 1116)    ED Course/ Medical Decision Making/ A&P  Medical Decision Making Amount and/or Complexity of Data Reviewed Labs: ordered.  Risk Prescription drug management.   Patient with lower abdominal pain.  Began somewhat last night.  Lab work reassuring.  Reviewed and shows normal white count.  Feeling better.  Tolerated orals.  Had been given antiemetics and some fluid.  Will discharge home.  Doubt appendicitis.  Doubt severe intra-abdominal infection.  Potentially could be a gastroenteritis just starting.  Will discharge home with outpatient follow-up as needed        Final Clinical Impression(s) / ED Diagnoses Final diagnoses:  Lower abdominal pain    Rx / DC Orders ED Discharge Orders          Ordered    ondansetron (ZOFRAN) 4 MG tablet  Every 8 hours PRN        09/23/21 1422               Benjiman Core, MD 09/23/21 1555

## 2021-09-23 NOTE — ED Notes (Signed)
Pt states he he feels much better and tolerated crackers and gingerale

## 2021-10-17 ENCOUNTER — Other Ambulatory Visit: Payer: Self-pay

## 2021-10-17 ENCOUNTER — Encounter (HOSPITAL_COMMUNITY): Payer: Self-pay | Admitting: Emergency Medicine

## 2021-10-17 ENCOUNTER — Emergency Department (HOSPITAL_COMMUNITY)
Admission: EM | Admit: 2021-10-17 | Discharge: 2021-10-17 | Disposition: A | Discharge status: Home / Self Care | Payer: Medicaid Other | Attending: Emergency Medicine | Admitting: Emergency Medicine

## 2021-10-17 DIAGNOSIS — R1030 Lower abdominal pain, unspecified: Secondary | ICD-10-CM | POA: Insufficient documentation

## 2021-10-17 DIAGNOSIS — R1032 Left lower quadrant pain: Secondary | ICD-10-CM

## 2021-10-17 DIAGNOSIS — Z91040 Latex allergy status: Secondary | ICD-10-CM | POA: Insufficient documentation

## 2021-10-17 MED ORDER — IBUPROFEN 400 MG PO TABS
600.0000 mg | ORAL_TABLET | Freq: Once | ORAL | Status: AC
  Administered 2021-10-17: 600 mg via ORAL
  Filled 2021-10-17: qty 2
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
## 2021-10-17 NOTE — Discharge Instructions (Signed)
Please take 600 mg ibuprofen every 6 hours for the next 3 to 5 days for pain.  You may also ice the area.  Please return to the emergency department for any worsening symptoms.

## 2022-01-06 IMAGING — DX DG ANKLE COMPLETE 3+V*R*
3 series · 3 of 3 positions shown · non-contrast
Comparison: None.

CLINICAL DATA: Twisting right ankle injury today.

EXAM:
RIGHT ANKLE - COMPLETE 3+ VIEW

[ankle ap]
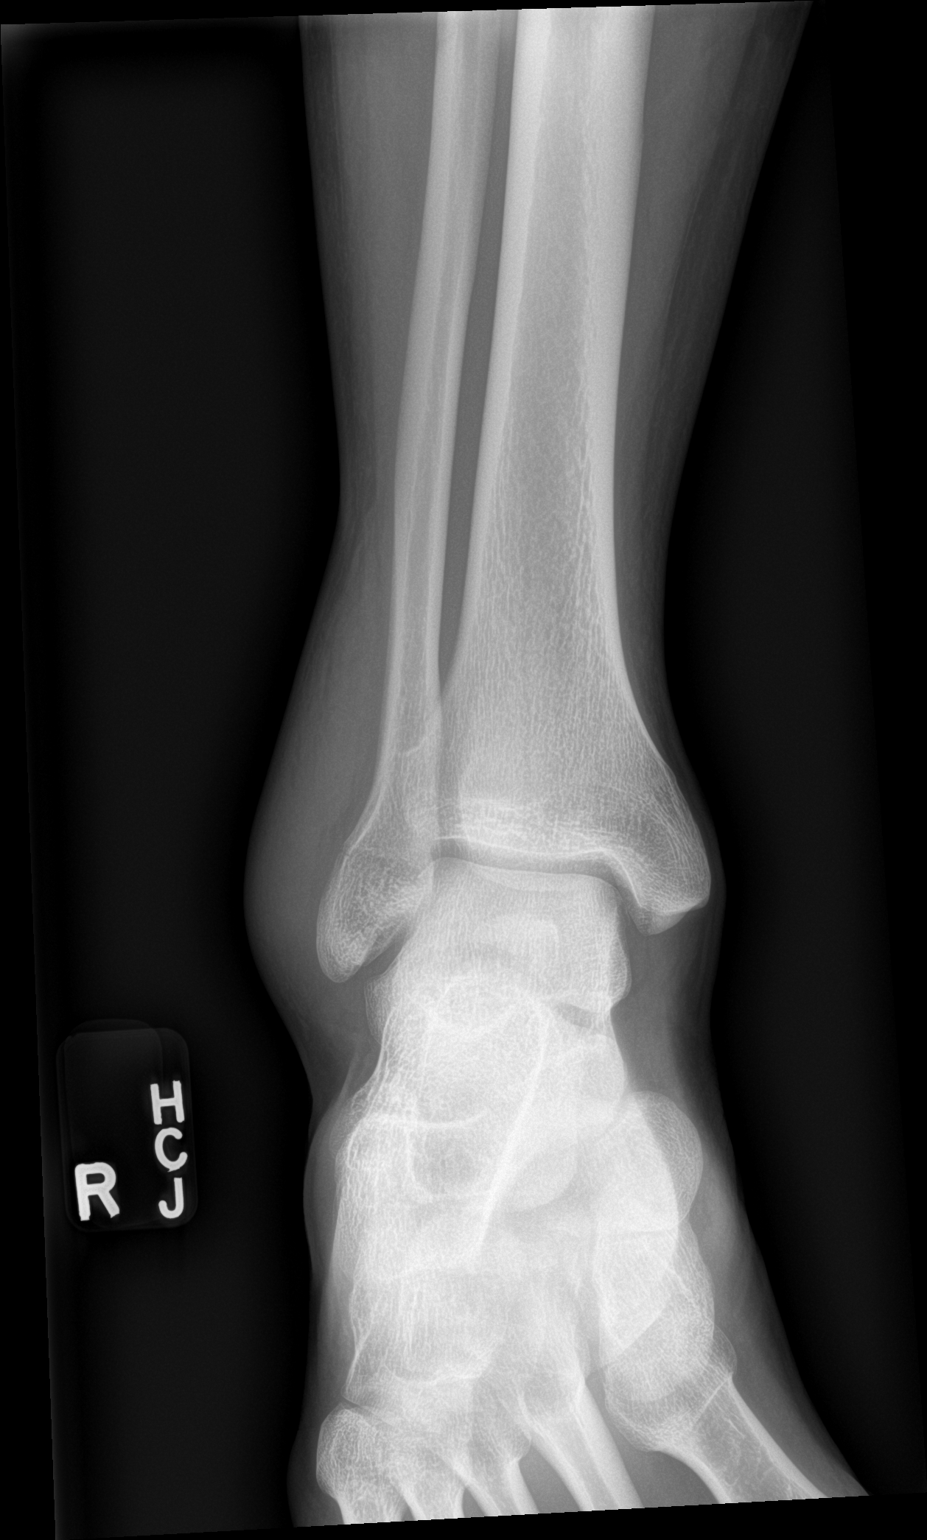

[ankle obl]
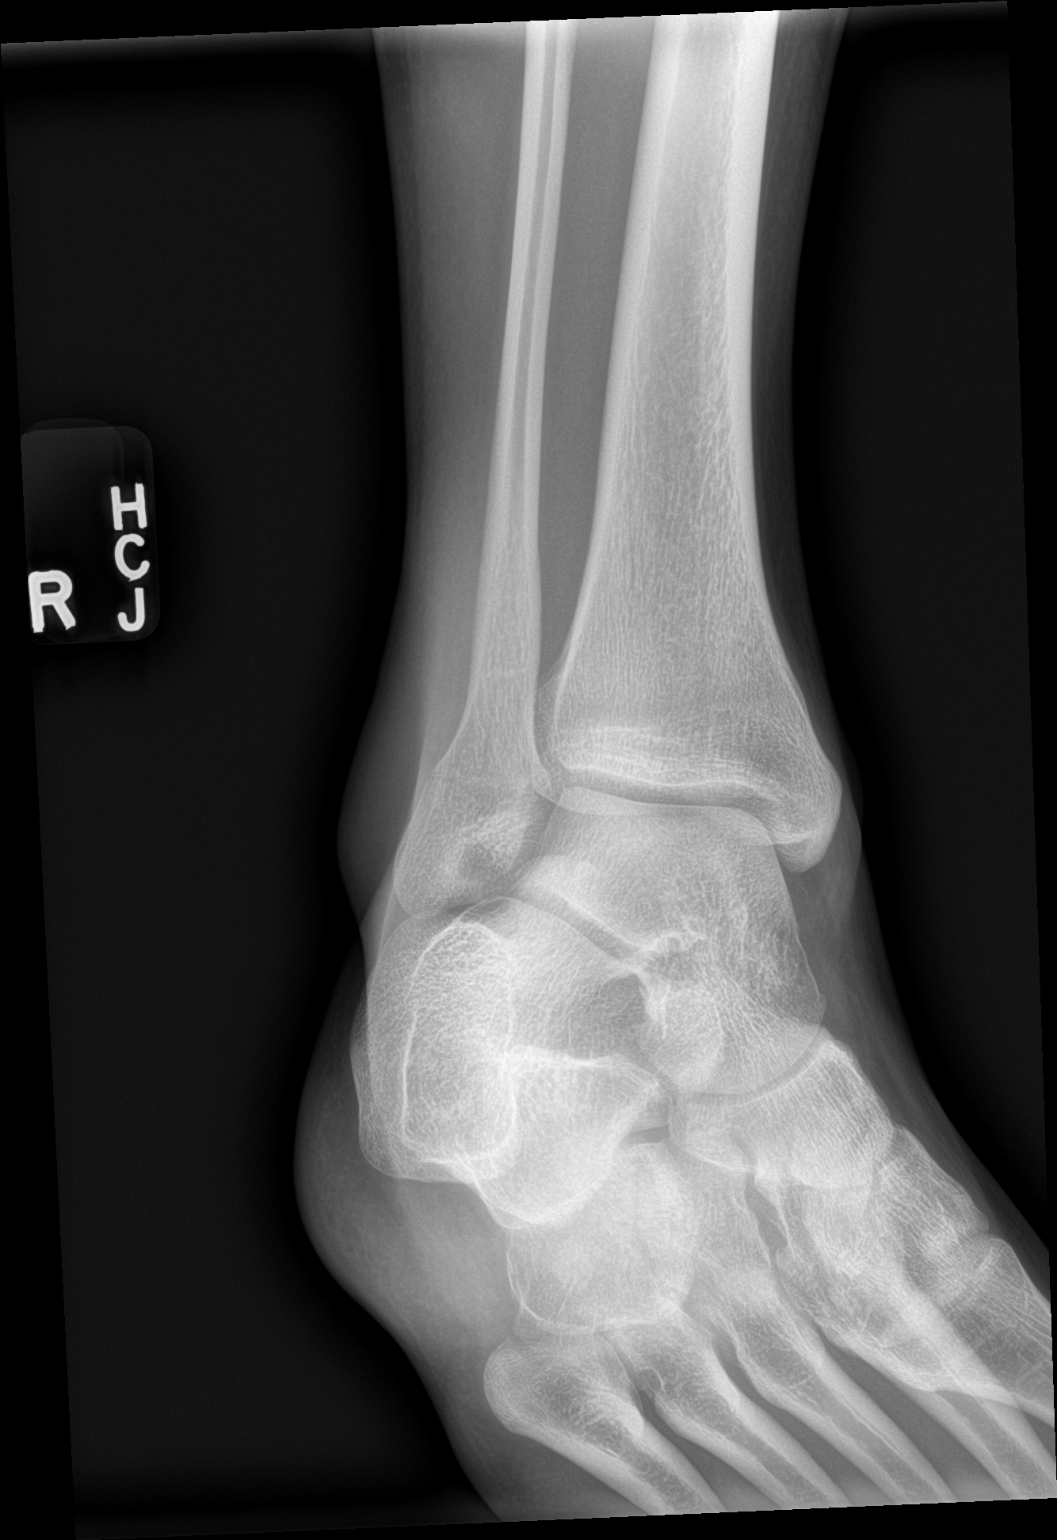

[ankle lat]
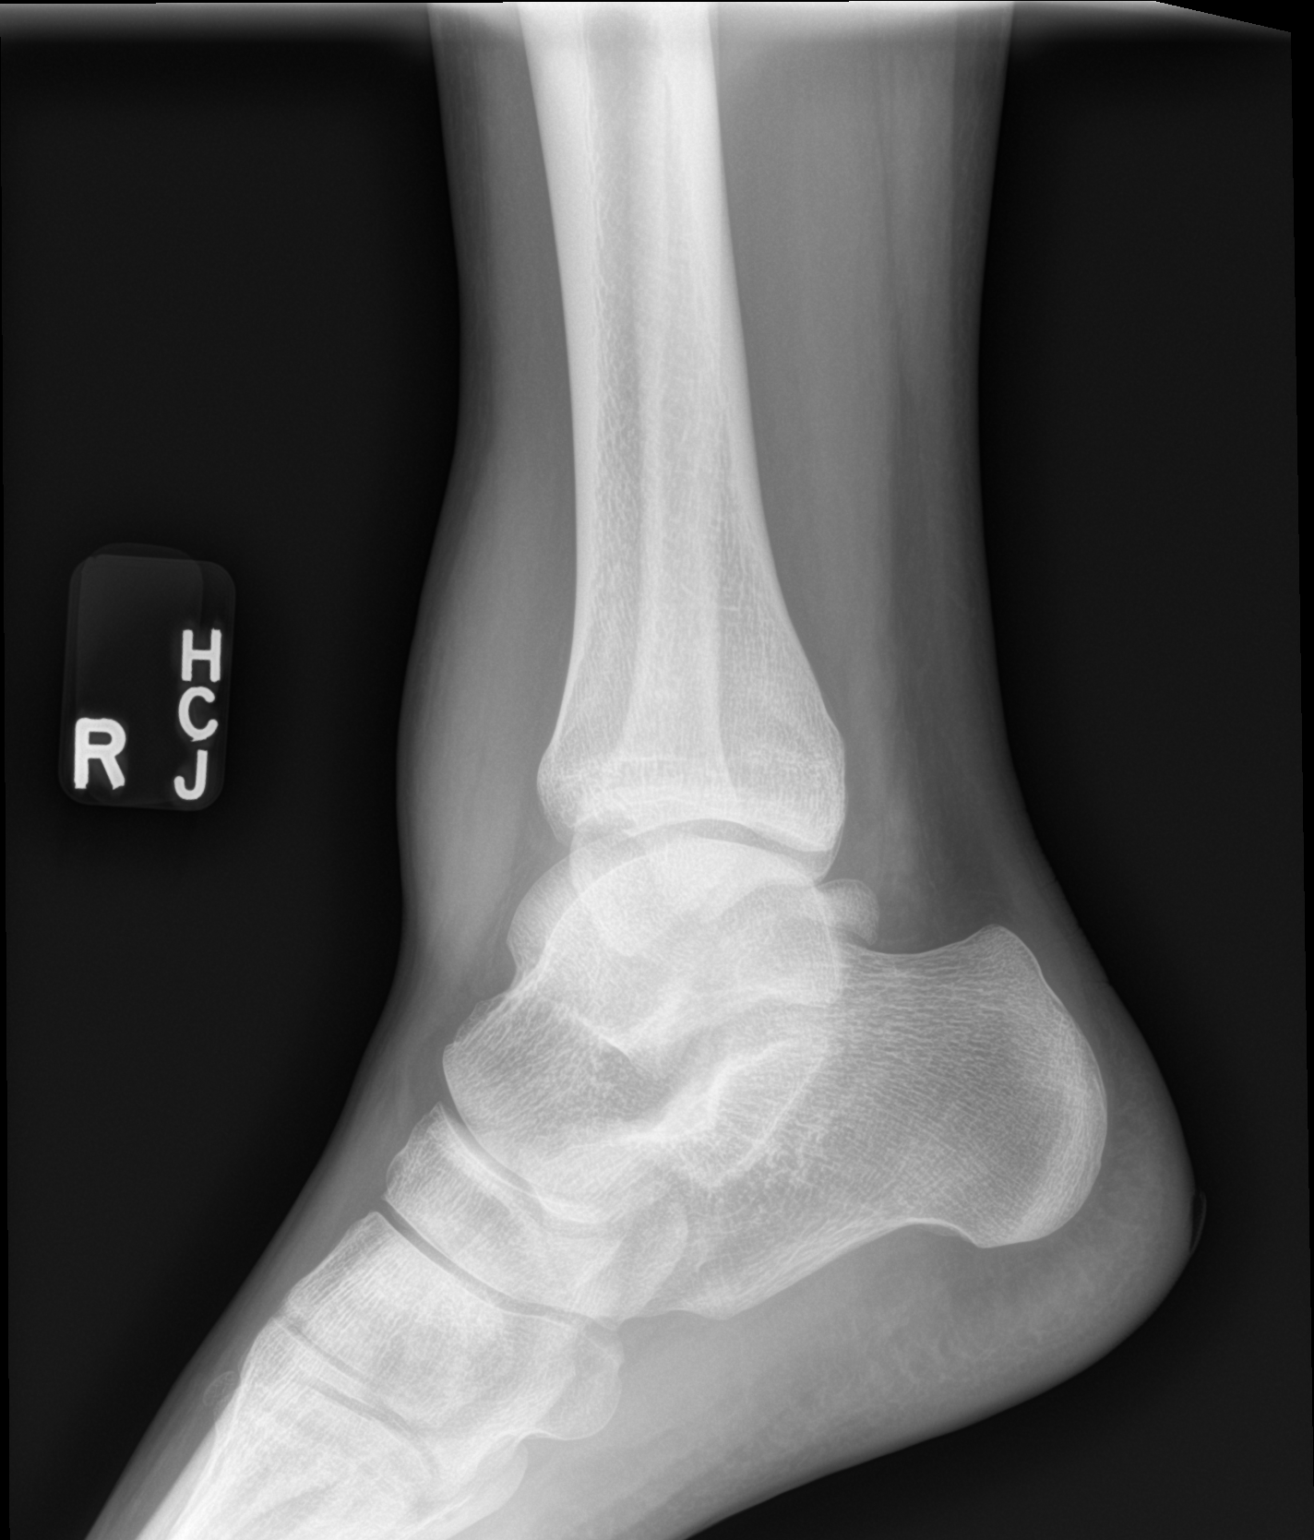

[3 of 3 positions shown; findings below may reference images not displayed]

FINDINGS: Moderate soft tissue swelling over the anterior lateral ankle. Ankle
mortise is normal. Suggestion of subtle focal cortical lucency along
the lateral aspect of the distal fibular head at the level of the
ankle mortise suggesting a nondisplaced fracture. Remainder of the
exam is unremarkable.
IMPRESSION: Suggestion of a subtle nondisplaced fracture involving the distal
fibular head. Associated soft tissue swelling.

## 2022-03-27 ENCOUNTER — Ambulatory Visit
Admission: EM | Admit: 2022-03-27 | Discharge: 2022-03-27 | Disposition: A | Discharge status: Home / Self Care | Payer: Medicaid Other | Attending: Nurse Practitioner | Admitting: Nurse Practitioner

## 2022-03-27 ENCOUNTER — Encounter: Payer: Self-pay | Admitting: Emergency Medicine

## 2022-03-27 DIAGNOSIS — J039 Acute tonsillitis, unspecified: Secondary | ICD-10-CM | POA: Insufficient documentation

## 2022-03-27 LAB — POCT MONO SCREEN (KUC): Mono, POC: NEGATIVE

## 2022-03-27 LAB — POCT RAPID STREP A (OFFICE): Rapid Strep A Screen: NEGATIVE

## 2022-03-27 MED ORDER — AMOXICILLIN 500 MG PO CAPS: 500.0000 mg | ORAL_CAPSULE | Freq: Two times a day (BID) | ORAL | 0 refills | Status: AC

## 2022-03-27 NOTE — ED Triage Notes (Signed)
Sore throat x 3 days

## 2022-03-27 NOTE — Discharge Instructions (Signed)
Test and monotest are negative.  A throat culture is pending.  You will be contacted if the pending test results are positive.  If the results are negative, you will be contacted and asked to stop the medication that was prescribed today. Increase fluids and allow for plenty of rest. May take over-the-counter Tylenol or ibuprofen as needed for pain, fever, or general discomfort. Warm salt water gargles 3-4 times daily while symptoms persist. Recommend a diet with soft food to include soup, broth, yogurt, pudding, Jell-O, and ice cream. Also recommend using warm tea with honey or lemon or cool fluids for throat pain or discomfort. If symptoms do not improve after this treatment, please follow-up with your primary care physician for further evaluation. Follow-up as needed.

## 2022-03-27 NOTE — ED Provider Notes (Signed)
[facility]    CSN: [id number] Arrival date & time: 03/27/22  1203      History   Chief Complaint No chief complaint on file.   HPI John Campbell is a 21 y.o. male.   The history is provided by the patient.   The patient presents with a 3-day history of sore throat.  Patient denies fever, chills, nasal congestion, cough, runny nose, abdominal pain, nausea, vomiting, or diarrhea.  Patient also reports that he has swollen lymph nodes.  Patient reports that he has not taken any medication for his symptoms, and denies any known sick contacts.  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 Authorizing Provider  amoxicillin (AMOXIL) 500 MG capsule Take 1 capsule (500 mg total) by mouth 2 (two) times daily for 10 days. 03/27/22 04/06/22 Yes King Pinzon-Warren, Sadie Haber, NP  amoxicillin-clavulanate (AUGMENTIN) 875-125 MG tablet Take 1 tablet by mouth 2 (two) times daily. Patient not taking: Reported on 09/23/2021 06/23/21   Wallis Bamberg, PA-C  EPINEPHrine 0.3 mg/0.3 mL IJ SOAJ injection Inject 0.3 mg into the muscle as needed for anaphylaxis.    [provider]  ibuprofen (ADVIL) 600 MG tablet Take 1 tablet (600 mg total) by mouth every 6 (six) hours as needed. Patient not taking: Reported on 09/23/2021 07/30/21   Cecil Cobbs, PA-C  ipratropium  (ATROVENT) 0.03 % nasal spray Place 2 sprays into both nostrils 2 (two) times daily. Patient not taking: Reported on 09/23/2021 09/27/20   Bing Neighbors, FNP  naproxen (NAPROSYN) 500 MG tablet Take 1 tablet (500 mg total) by mouth 2 (two) times daily with a meal. Patient not taking: Reported on 09/23/2021 06/23/21   Wallis Bamberg, PA-C  ondansetron (ZOFRAN) 4 MG tablet Take 1 tablet (4 mg total) by mouth every 8 (eight) hours as needed for nausea or vomiting. 09/23/21   Benjiman Core, MD  oxyCODONE-acetaminophen (PERCOCET/ROXICET) 5-325 MG tablet Take 1 tablet by mouth every 6 (six) hours as needed for severe pain. Patient not taking: Reported on 09/23/2021 07/25/21   Bethann Berkshire, MD    Family History History reviewed. No pertinent family history.  Social History Social History   Tobacco Use   Smoking status: Former    Types: Cigarettes    Quit date: 12/01/2019    Years since quitting: 2.3   Smokeless tobacco: Never  Vaping Use   Vaping Use: Some days  Substance Use Topics   Alcohol use: No   Drug use: Not Currently    Types: Marijuana    Comment: states he has not used in weeks     Allergies   Latex and Other   Review of Systems Review of Systems Per HPI  Physical Exam Triage Vital Signs ED Triage Vitals  Enc Vitals Group     BP 03/27/22 1355 (!) 142/76  Pulse Rate 03/27/22 1355 71     Resp 03/27/22 1355 18     Temp 03/27/22 1355 97.8 F (36.6 C)     Temp Source 03/27/22 1355 Oral     SpO2 03/27/22 1355 98 %     Weight --      Height --      Head Circumference --      Peak Flow --      Pain Score 03/27/22 1356 6     Pain Loc --      Pain Edu? --      Excl. in GC? --    No data found.  Updated Vital Signs BP (!) 142/76 (BP Location: Right Arm)   Pulse 71   Temp 97.8 F (36.6 C) (Oral)   Resp 18   SpO2 98%   Visual Acuity Right Eye Distance:   Left Eye Distance:   Bilateral Distance:    Right Eye Near:   Left Eye Near:    Bilateral  Near:     Physical Exam Vitals and nursing note reviewed.  Constitutional:      General: He is not in acute distress.    Appearance: Normal appearance.  HENT:     Head: Normocephalic.     Right Ear: Tympanic membrane, ear canal and external ear normal.     Left Ear: Tympanic membrane, ear canal and external ear normal.     Nose: Nose normal.     Mouth/Throat:     Lips: Pink.     Mouth: Mucous membranes are moist.     Pharynx: Uvula midline. Pharyngeal swelling, oropharyngeal exudate, posterior oropharyngeal erythema and uvula swelling present.     Tonsils: Tonsillar exudate present. 2+ on the right. 2+ on the left.  Eyes:     Extraocular Movements: Extraocular movements intact.     Conjunctiva/sclera: Conjunctivae normal.     Pupils: Pupils are equal, round, and reactive to light.  Cardiovascular:     Rate and Rhythm: Normal rate and regular rhythm.     Pulses: Normal pulses.     Heart sounds: Normal heart sounds.  Pulmonary:     Effort: Pulmonary effort is normal. No respiratory distress.     Breath sounds: Normal breath sounds. No stridor. No wheezing, rhonchi or rales.  Abdominal:     General: Bowel sounds are normal.     Palpations: Abdomen is soft.  Musculoskeletal:     Cervical back: Normal range of motion.  Lymphadenopathy:     Cervical: Cervical adenopathy present.  Skin:    General: Skin is warm and dry.  Neurological:     General: No focal deficit present.     Mental Status: He is alert and oriented to person, place, and time.  Psychiatric:        Mood and Affect: Mood normal.        Behavior: Behavior normal.      UC Treatments / Results  Labs (all labs ordered are listed, but only abnormal results are displayed) Labs Reviewed  CULTURE, GROUP A STREP [facility])  POCT RAPID STREP A (OFFICE)  POCT MONO SCREEN ([facility])    EKG   Radiology No results found.  Procedures Procedures (including critical care time)  Medications Ordered in UC Medications -  No data to display  Initial Impression / Assessment and Plan / UC Course  I have reviewed the triage vital signs and the nursing notes.  Pertinent labs & imaging results that were available during my  care of the patient were reviewed by me and considered in my medical decision making (see chart for details).  The patient is well-appearing, he is in no acute distress, he is mildly hypertensive, but vital signs are otherwise stable.  Rapid strep test and monotest are negative, throat culture is pending..  Based on the patient's symptoms, and physical exam, suspect acute tonsillitis.  Will start patient on amoxicillin 500 mg while the throat culture is pending.  Supportive care recommendations were provided to the patient to include warm salt water gargles 3-4 times daily, over-the-counter analgesics for pain or discomfort, and a soft diet.  Patient was advised if the results of the culture are negative, he will need to stop the medication.  Patient verbalizes understanding.  All questions were answered.  Patient is stable for discharge.  Final Clinical Impressions(s) / UC Diagnoses   Final diagnoses:  Acute tonsillitis, unspecified etiology     Discharge Instructions      Test and monotest are negative.  A throat culture is pending.  You will be contacted if the pending test results are positive.  If the results are negative, you will be contacted and asked to stop the medication that was prescribed today. Increase fluids and allow for plenty of rest. May take over-the-counter Tylenol or ibuprofen as needed for pain, fever, or general discomfort. Warm salt water gargles 3-4 times daily while symptoms persist. Recommend a diet with soft food to include soup, broth, yogurt, pudding, Jell-O, and ice cream. Also recommend using warm tea with honey or lemon or cool fluids for throat pain or discomfort. If symptoms do not improve after this treatment, please follow-up with your primary care  physician for further evaluation. Follow-up as needed.     ED Prescriptions     Medication Sig Dispense Auth. Provider   amoxicillin (AMOXIL) 500 MG capsule Take 1 capsule (500 mg total) by mouth 2 (two) times daily for 10 days. 20 capsule Kymberlie Brazeau-Warren, Sadie Haber, NP      PDMP not reviewed this encounter.   Abran Cantor, NP 03/27/22 1456

## 2022-03-30 ENCOUNTER — Encounter (HOSPITAL_COMMUNITY): Payer: Self-pay

## 2022-03-30 ENCOUNTER — Other Ambulatory Visit: Payer: Self-pay

## 2022-03-30 DIAGNOSIS — J020 Streptococcal pharyngitis: Secondary | ICD-10-CM | POA: Insufficient documentation

## 2022-03-30 DIAGNOSIS — M7918 Myalgia, other site: Secondary | ICD-10-CM | POA: Diagnosis not present

## 2022-03-30 DIAGNOSIS — J029 Acute pharyngitis, unspecified: Secondary | ICD-10-CM | POA: Diagnosis present

## 2022-03-30 DIAGNOSIS — Z91040 Latex allergy status: Secondary | ICD-10-CM | POA: Insufficient documentation

## 2022-03-30 LAB — CULTURE, GROUP A STREP (THRC)

## 2022-03-30 NOTE — ED Triage Notes (Signed)
Pt arrived via POV c/o persistent sore throat. Pt reports being evaluated and treated recently at [facility] and was prescribed medications for Tonsillitis. Pt reports taking medications as prescribed and gargling salt water w/o relief.

## 2022-03-31 ENCOUNTER — Emergency Department (HOSPITAL_COMMUNITY)
Admission: EM | Admit: 2022-03-31 | Discharge: 2022-03-31 | Disposition: A | Discharge status: Home / Self Care | Payer: Medicaid Other | Attending: Emergency Medicine | Admitting: Emergency Medicine

## 2022-03-31 DIAGNOSIS — J020 Streptococcal pharyngitis: Secondary | ICD-10-CM

## 2022-03-31 LAB — COMPREHENSIVE METABOLIC PANEL
ALT: 51 U/L — ABNORMAL HIGH (ref 0–44)
AST: 52 U/L — ABNORMAL HIGH (ref 15–41)
Albumin: 4.3 g/dL (ref 3.5–5.0)
Alkaline Phosphatase: 71 U/L (ref 38–126)
Anion gap: 10 (ref 5–15)
BUN: 12 mg/dL (ref 6–20)
CO2: 27 mmol/L (ref 22–32)
Calcium: 9.2 mg/dL (ref 8.9–10.3)
Chloride: 101 mmol/L (ref 98–111)
Creatinine, Ser: 0.96 mg/dL (ref 0.61–1.24)
GFR, Estimated: >60 mL/min (ref >60)
Glucose, Bld: 106 mg/dL — ABNORMAL HIGH (ref 70–99)
Potassium: 3.1 mmol/L — ABNORMAL LOW (ref 3.5–5.1)
Sodium: 138 mmol/L (ref 135–145)
Total Bilirubin: 1.2 mg/dL (ref 0.3–1.2)
Total Protein: 8.4 g/dL — ABNORMAL HIGH (ref 6.5–8.1)

## 2022-03-31 LAB — CBC WITH DIFFERENTIAL/PLATELET
Abs Immature Granulocytes: 0.03 10*3/uL (ref 0.00–0.07)
Basophils Absolute: 0 10*3/uL (ref 0.0–0.1)
Basophils Relative: 1 %
Eosinophils Absolute: 0.1 10*3/uL (ref 0.0–0.5)
Eosinophils Relative: 1 %
HCT: 45.9 % (ref 39.0–52.0)
Hemoglobin: 16.2 g/dL (ref 13.0–17.0)
Immature Granulocytes: 0 %
Lymphocytes Relative: 17 %
Lymphs Abs: 1.5 10*3/uL (ref 0.7–4.0)
MCH: 30.2 pg (ref 26.0–34.0)
MCHC: 35.3 g/dL (ref 30.0–36.0)
MCV: 85.6 fL (ref 80.0–100.0)
Monocytes Absolute: 1.6 10*3/uL — ABNORMAL HIGH (ref 0.1–1.0)
Monocytes Relative: 18 %
Neutro Abs: 5.6 10*3/uL (ref 1.7–7.7)
Neutrophils Relative %: 63 %
Platelets: 183 10*3/uL (ref 150–400)
RBC: 5.36 MIL/uL (ref 4.22–5.81)
RDW: 12.3 % (ref 11.5–15.5)
WBC: 8.7 10*3/uL (ref 4.0–10.5)
nRBC: 0 % (ref 0.0–0.2)

## 2022-03-31 MED ORDER — KETOROLAC TROMETHAMINE 30 MG/ML IJ SOLN
30.0000 mg | Freq: Once | INTRAMUSCULAR | Status: AC
  Administered 2022-03-31: 30 mg via INTRAVENOUS
  Filled 2022-03-31: qty 1

## 2022-03-31 MED ORDER — DEXAMETHASONE SODIUM PHOSPHATE 10 MG/ML IJ SOLN
10.0000 mg | Freq: Once | INTRAMUSCULAR | Status: AC
  Administered 2022-03-31: 10 mg via INTRAVENOUS
  Filled 2022-03-31: qty 1

## 2022-03-31 MED ORDER — CEFDINIR 300 MG PO CAPS: 300.0000 mg | ORAL_CAPSULE | Freq: Two times a day (BID) | ORAL | 0 refills | Status: AC

## 2022-03-31 MED ORDER — LACTATED RINGERS IV BOLUS
1000.0000 mL | Freq: Once | INTRAVENOUS | Status: AC
  Administered 2022-03-31: 1000 mL via INTRAVENOUS

## 2022-04-01 NOTE — ED Provider Notes (Signed)
[facility] EMERGENCY DEPARTMENT Provider Note   CSN: [id number] Arrival date & time: 03/30/22  2207     History  Chief Complaint  Patient presents with   Sore Throat    John Campbell is a 21 y.o. male.  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  Patient states he has been on antibiotics for 3 days and does not seem to have gotten better.  Has body aches as well.  No trouble swallowing or breathing.  Does have swollen lymph nodes.  No headache or other associated symptoms.   Sore Throat       Home Medications Prior to Admission medications   Medication Sig Start Date End Date Taking? Authorizing Provider  cefdinir (OMNICEF) 300 MG capsule Take 1 capsule (300 mg total) by mouth 2 (two) times daily for 10 days. If amoxicillin doesn't work 03/31/22 04/10/22 Yes Bunyan Brier, Barbara Cower, MD  amoxicillin (AMOXIL) 500 MG capsule Take 1 capsule (500 mg total) by mouth 2 (two) times daily for 10 days. 03/27/22 04/06/22  Leath-Warren, Sadie Haber, NP  amoxicillin-clavulanate (AUGMENTIN) 875-125 MG tablet Take 1 tablet by mouth 2 (two) times daily. Patient not taking: Reported on 09/23/2021 06/23/21   Wallis Bamberg, PA-C  EPINEPHrine 0.3 mg/0.3 mL IJ SOAJ injection Inject 0.3 mg into the muscle as needed for anaphylaxis.    [provider]  ibuprofen (ADVIL) 600 MG tablet Take 1 tablet (600 mg total) by mouth every 6 (six) hours as needed. Patient not taking: Reported on 09/23/2021 07/30/21   Cecil Cobbs, PA-C  ipratropium (ATROVENT) 0.03 % nasal spray Place 2 sprays into both nostrils 2 (two) times daily. Patient not taking: Reported on 09/23/2021 09/27/20   Bing Neighbors, FNP  naproxen (NAPROSYN) 500 MG tablet Take 1 tablet (500 mg total) by mouth 2 (two) times daily with a meal. Patient not taking: Reported on 09/23/2021 06/23/21   Wallis Bamberg, PA-C  ondansetron (ZOFRAN) 4 MG tablet Take 1 tablet (4 mg total) by mouth every 8 (eight)  hours as needed for nausea or vomiting. 09/23/21   Benjiman Core, MD  oxyCODONE-acetaminophen (PERCOCET/ROXICET) 5-325 MG tablet Take 1 tablet by mouth every 6 (six) hours as needed for severe pain. Patient not taking: Reported on 09/23/2021 07/25/21   Bethann Berkshire, MD      Allergies    Latex and Other    Review of Systems   Review of Systems  Physical Exam Updated Vital Signs BP 107/68 (BP Location: Left Arm)   Pulse (!) 56   Temp 98.1 F (36.7 C) (Oral)   Resp 18   Ht 5\' 10"  (1.778 m)   Wt 77.1 kg   SpO2 97%   BMI 24.39 kg/m  Physical Exam Vitals and nursing note reviewed.  Constitutional:      Appearance: He is well-developed.  HENT:     Head: Normocephalic and atraumatic.     Comments: Symmetric mildly edematous and erythematous tonsils and posterior oropharyngeal erythema.    Mouth/Throat:     Mouth: Mucous membranes are moist.  Cardiovascular:     Rate and Rhythm: Normal rate.  Pulmonary:     Effort: Pulmonary effort is normal. No respiratory distress.     Breath sounds: No wheezing.  Abdominal:     General: There is no distension.     Palpations: Abdomen is soft.     Tenderness: There is no abdominal tenderness.  Musculoskeletal:        General: Normal  range of motion.     Cervical back: Normal range of motion.  Skin:    General: Skin is warm and dry.  Neurological:     Mental Status: He is alert.     ED Results / Procedures / Treatments   Labs (all labs ordered are listed, but only abnormal results are displayed) Labs Reviewed  CBC WITH DIFFERENTIAL/PLATELET - Abnormal; Notable for the following components:      Result Value   Monocytes Absolute 1.6 (*)    All other components within normal limits  COMPREHENSIVE METABOLIC PANEL - Abnormal; Notable for the following components:   Potassium 3.1 (*)    Glucose, Bld 106 (*)    Total Protein 8.4 (*)    AST 52 (*)    ALT 51 (*)    All other components within normal limits     EKG None  Radiology No results found.  Procedures Procedures    Medications Ordered in ED Medications  lactated ringers bolus 1,000 mL (0 mLs Intravenous Stopped 03/31/22 0631)  ketorolac (TORADOL) 30 MG/ML injection 30 mg (30 mg Intravenous Given 03/31/22 0346)  dexamethasone (DECADRON) injection 10 mg (10 mg Intravenous Given 03/31/22 0346)    ED Course/ Medical Decision Making/ A&P                           Medical Decision Making Amount and/or Complexity of Data Reviewed Labs: ordered.  Risk Prescription drug management.   No evidence of peritonsillar abscess or obvious retropharyngeal abscess.  Patient protecting airway.  Symptoms improved with decadron, fluids and Toradol here.  Prescription for cefdinir given if continuing to not improve over the next 24 to 48 hours.  Return here for any airway issues or worsening symptoms.  Otherwise follow-up with PCP for reevaluation in 3 to 4 days.   Final Clinical Impression(s) / ED Diagnoses Final diagnoses:  Pharyngitis due to Streptococcus species    Rx / DC Orders ED Discharge Orders          Ordered    cefdinir (OMNICEF) 300 MG capsule  2 times daily        03/31/22 0618              Emilija Bohman, Barbara Cower, MD 04/01/22 [phone]

## 2022-04-05 ENCOUNTER — Other Ambulatory Visit: Payer: Self-pay

## 2022-04-05 ENCOUNTER — Encounter (HOSPITAL_COMMUNITY): Payer: Self-pay

## 2022-04-05 ENCOUNTER — Emergency Department (HOSPITAL_COMMUNITY)
Admission: EM | Admit: 2022-04-05 | Discharge: 2022-04-05 | Disposition: A | Discharge status: Home / Self Care | Payer: Medicaid Other | Attending: Emergency Medicine | Admitting: Emergency Medicine

## 2022-04-05 DIAGNOSIS — K0889 Other specified disorders of teeth and supporting structures: Secondary | ICD-10-CM | POA: Insufficient documentation

## 2022-04-05 DIAGNOSIS — Z91040 Latex allergy status: Secondary | ICD-10-CM | POA: Insufficient documentation

## 2022-04-05 MED ORDER — HYDROCODONE-ACETAMINOPHEN 5-325 MG PO TABS: ORAL_TABLET | ORAL | 0 refills | Status: DC

## 2022-04-05 MED ORDER — HYDROCODONE-ACETAMINOPHEN 5-325 MG PO TABS
1.0000 | ORAL_TABLET | Freq: Once | ORAL | Status: AC
  Administered 2022-04-05: 1 via ORAL
  Filled 2022-04-05: qty 1

## 2022-04-05 NOTE — Discharge Instructions (Signed)
Warm salt water rinses on and off to your mouth.  Continue taking ibuprofen, 800 mg 3 times a day with food.  Please follow-up with your dentist for recheck if needed.

## 2022-04-05 NOTE — ED Triage Notes (Signed)
Pt reports had tooth pulled at 1030 this morning.  C/O pain since numbing wore off.

## 2022-04-05 NOTE — ED Provider Notes (Signed)
[facility] EMERGENCY DEPARTMENT Provider Note   CSN: [id number] Arrival date & time: 04/05/22  1402     History  Chief Complaint  Patient presents with   Dental Pain    John Campbell is a 21 y.o. male.   Dental Pain Associated symptoms: no fever        John Campbell is a 21 y.o. male who presents to the Emergency Department complaining of right lower jaw pain since this morning.  He had one of his right lower molars extracted this morning, states since the "numbing" wore off he has been having throbbing pain to his jaw area.  States that the dentist did not want to give him any pain medication.  He has taken 800 mg ibuprofen without relief.  He denies any difficulty swallowing, fever, chills, or facial swelling.     Home Medications Prior to Admission medications   Medication Sig Start Date End Date Taking? Authorizing Provider  amoxicillin (AMOXIL) 500 MG capsule Take 1 capsule (500 mg total) by mouth 2 (two) times daily for 10 days. 03/27/22 04/06/22  Leath-Warren, Sadie Haber, NP  amoxicillin-clavulanate (AUGMENTIN) 875-125 MG tablet Take 1 tablet by mouth 2 (two) times daily. Patient not taking: Reported on 09/23/2021 06/23/21   Wallis Bamberg, PA-C  cefdinir (OMNICEF) 300 MG capsule Take 1 capsule (300 mg total) by mouth 2 (two) times daily for 10 days. If amoxicillin doesn't work 03/31/22 04/10/22  Mesner, Barbara Cower, MD  EPINEPHrine 0.3 mg/0.3 mL IJ SOAJ injection Inject 0.3 mg into the muscle as needed for anaphylaxis.    [provider]  ibuprofen (ADVIL) 600 MG tablet Take 1 tablet (600 mg total) by mouth every 6 (six) hours as needed. Patient not taking: Reported on 09/23/2021 07/30/21   Cecil Cobbs, PA-C  ipratropium (ATROVENT) 0.03 % nasal spray Place 2 sprays into both nostrils 2 (two) times daily. Patient not taking: Reported on 09/23/2021 09/27/20   Bing Neighbors, FNP  naproxen (NAPROSYN) 500 MG tablet Take 1 tablet (500 mg total) by mouth 2 (two) times  daily with a meal. Patient not taking: Reported on 09/23/2021 06/23/21   Wallis Bamberg, PA-C  ondansetron (ZOFRAN) 4 MG tablet Take 1 tablet (4 mg total) by mouth every 8 (eight) hours as needed for nausea or vomiting. 09/23/21   Benjiman Core, MD  oxyCODONE-acetaminophen (PERCOCET/ROXICET) 5-325 MG tablet Take 1 tablet by mouth every 6 (six) hours as needed for severe pain. Patient not taking: Reported on 09/23/2021 07/25/21   Bethann Berkshire, MD      Allergies    Latex and Other    Review of Systems   Review of Systems  Constitutional:  Negative for appetite change, chills and fever.  HENT:  Positive for dental problem. Negative for nosebleeds, sore throat and trouble swallowing.   Respiratory:  Negative for shortness of breath.   Cardiovascular:  Negative for chest pain.    Physical Exam Updated Vital Signs BP (!) 145/95 (BP Location: Right Arm)   Pulse 64   Temp 97.8 F (36.6 C) (Temporal)   Resp 18   Ht 5\' 10"  (1.778 m)   Wt 77.1 kg   SpO2 100%   BMI 24.39 kg/m  Physical Exam Vitals and nursing note reviewed.  Constitutional:      General: He is not in acute distress.    Appearance: Normal appearance. He is not toxic-appearing.     Comments: Patient is uncomfortable appearing, holding right jaw and his hand.  HENT:  Right Ear: Tympanic membrane and ear canal normal.     Left Ear: Tympanic membrane and ear canal normal.     Mouth/Throat:     Mouth: Mucous membranes are moist.     Dentition: No gingival swelling or gum lesions.     Pharynx: Oropharynx is clear. Uvula midline. No uvula swelling.     Comments: Tender to palpation of the right lower gingiva.  Status post dental extraction right lower second molar.  No bleeding or edema of the surrounding gums. Eyes:     Conjunctiva/sclera: Conjunctivae normal.  Cardiovascular:     Rate and Rhythm: Normal rate and regular rhythm.     Pulses: Normal pulses.  Pulmonary:     Effort: Pulmonary effort is normal.   Musculoskeletal:        General: Normal range of motion.     Cervical back: Normal range of motion.  Skin:    General: Skin is warm.  Neurological:     General: No focal deficit present.     Mental Status: He is alert.     Sensory: No sensory deficit.     Motor: No weakness.     ED Results / Procedures / Treatments   Labs (all labs ordered are listed, but only abnormal results are displayed) Labs Reviewed - No data to display  EKG None  Radiology No results found.  Procedures Procedures    Medications Ordered in ED Medications - No data to display  ED Course/ Medical Decision Making/ A&P                           Medical Decision Making Patient here for dental pain postextraction this morning.  Describes throbbing sharp pain of his right lower jaw area.  No facial swelling or difficulty swallowing.  Had his right lower molar extracted this morning.  Here requesting pain control.  On exam, patient well-appearing nontoxic.  No facial edema.  Handling secretions well.  No abnormalities to the floor of the mouth.  No fever or chills.  Patient with likely postextraction dental pain.  No concerning symptoms for infection at this time.  No bleeding of the gums.  No concerning symptoms for Ludewig's angina.    Amount and/or Complexity of Data Reviewed Discussion of management or test interpretation with external provider(s): Patient appropriate for discharge home, all no indication for antibiotics at this time.  Database reviewed.  Will provide short course of pain medication and he will follow-up with his dentist.          Final Clinical Impression(s) / ED Diagnoses Final diagnoses:  Pain, dental    Rx / DC Orders ED Discharge Orders     None         Pauline Aus, PA-C 04/05/22 1705    Terald Sleeper, MD 04/05/22 1857

## 2023-01-08 ENCOUNTER — Encounter: Payer: Self-pay | Admitting: Family Medicine

## 2023-01-08 ENCOUNTER — Ambulatory Visit: Payer: Medicaid Other | Admitting: Family Medicine

## 2023-01-08 VITALS — BP 138/86 | HR 64 | Ht 70.5 in | Wt 162.0 lb

## 2023-01-08 DIAGNOSIS — F902 Attention-deficit hyperactivity disorder, combined type: Secondary | ICD-10-CM | POA: Diagnosis not present

## 2023-01-08 DIAGNOSIS — Z114 Encounter for screening for human immunodeficiency virus [HIV]: Secondary | ICD-10-CM

## 2023-01-08 DIAGNOSIS — R7301 Impaired fasting glucose: Secondary | ICD-10-CM

## 2023-01-08 DIAGNOSIS — E7849 Other hyperlipidemia: Secondary | ICD-10-CM

## 2023-01-08 DIAGNOSIS — Z1159 Encounter for screening for other viral diseases: Secondary | ICD-10-CM

## 2023-01-08 DIAGNOSIS — E559 Vitamin D deficiency, unspecified: Secondary | ICD-10-CM | POA: Diagnosis not present

## 2023-01-08 DIAGNOSIS — F411 Generalized anxiety disorder: Secondary | ICD-10-CM | POA: Diagnosis not present

## 2023-01-08 DIAGNOSIS — F410 Panic disorder [episodic paroxysmal anxiety] without agoraphobia: Secondary | ICD-10-CM

## 2023-01-08 DIAGNOSIS — E038 Other specified hypothyroidism: Secondary | ICD-10-CM

## 2023-01-08 MED ORDER — BUSPIRONE HCL 5 MG PO TABS: 5.0000 mg | ORAL_TABLET | Freq: Two times a day (BID) | ORAL | 1 refills | Status: AC

## 2023-01-08 MED ORDER — BUPROPION HCL ER (XL) 150 MG PO TB24: 150.0000 mg | ORAL_TABLET | Freq: Every day | ORAL | 1 refills | Status: AC

## 2023-01-08 NOTE — Assessment & Plan Note (Signed)
Will treat today with Wellbutrin XL 150 mg daily Recommended neuropsychiatric testing to obtain a definitive diagnosis of Attention Deficit Disorder (ADD). -Here are some options to get the neuropsychiatric testing to have a definitive answer on if you have ADD or not: Eula Flax, NP at [facility] [phone]) or [facility] [phone]) or Ronney Asters at [facility] (part of [facility]) [phone] has multiple providers. [facility] [phone] has several providers specializing in ADHD/Bipolar= Andrena Mews, PhD is expert at Adult ADHD, Verna Czech, PhD treats adults with both diagnoses.

## 2023-01-08 NOTE — Progress Notes (Signed)
New Patient Office Visit  Subjective:  Patient ID: Scott Vanderveer, male    DOB: 09-01-2000  Age: 22 y.o. MRN: [id number]  CC:  Chief Complaint  Patient presents with   Establish Care    New patient establishing care. Pt would ike to discuss medications for adhd and anxiety, previously on this before.     HPI Felder Lebeda is a 22 y.o. male with past medical history of anxiety and ADHD presents for establishing care.   Anxiety:The patient reports having been on Zoloft 50 mg daily in 2022 but discontinued the medication because he did not like how it made him feel. Recently, he has experienced a peak in anxiety, particularly noting heightened feelings of anxiety during unfamiliar activities. He denies any suicidal thoughts or ideation.  ADHD:The patient was previously prescribed Adderall in 2022 and was weaned off the medication due to stability in his symptoms. He expresses a desire to resume therapy for ADHD today.  Past Medical History:  Diagnosis Date   ADHD (attention deficit hyperactivity disorder), combined type    Anxiety    Other insomnia 12/09/2018    Past Surgical History:  Procedure Laterality Date   MOUTH SURGERY      History reviewed. No pertinent family history.  Social History   Socioeconomic History   Marital status: Single    Spouse name: Not on file   Number of children: Not on file   Years of education: Not on file   Highest education level: Not on file  Occupational History   Not on file  Tobacco Use   Smoking status: Every Day    Types: E-cigarettes   Smokeless tobacco: Never  Vaping Use   Vaping status: Some Days  Substance and Sexual Activity   Alcohol use: No   Drug use: Not Currently    Types: Marijuana    Comment: daily   Sexual activity: Never  Other Topics Concern   Not on file  Social History Narrative   Not on file   Social Determinants of Health   Financial Resource Strain: Not on file  Food Insecurity: Not on file   Transportation Needs: Not on file  Physical Activity: Not on file  Stress: Not on file  Social Connections: Not on file  Intimate Partner Violence: Not on file    ROS Review of Systems  Constitutional:  Negative for fatigue and fever.  Eyes:  Negative for visual disturbance.  Respiratory:  Negative for chest tightness and shortness of breath.   Cardiovascular:  Negative for chest pain and palpitations.  Neurological:  Negative for dizziness and headaches.    Objective:   Today's Vitals: BP 138/86   Pulse 64   Ht 5' 10.5" (1.791 m)   Wt 162 lb (73.5 kg)   SpO2 98%   BMI 22.92 kg/m   Physical Exam HENT:     Head: Normocephalic.     Right Ear: External ear normal.     Left Ear: External ear normal.     Nose: No congestion or rhinorrhea.     Mouth/Throat:     Mouth: Mucous membranes are moist.  Cardiovascular:     Rate and Rhythm: Regular rhythm.     Heart sounds: No murmur heard. Pulmonary:     Effort: No respiratory distress.     Breath sounds: Normal breath sounds.  Neurological:     Mental Status: He is alert.      Assessment & Plan:   Generalized anxiety disorder with panic  attacks Assessment & Plan: Will treat today with BuSpar 5 mg twice daily Encourage nonpharmacological interventions such as mindfulness, meditation, and deep breathing exercises Referral placed to integrated behavioral health for talk therapy   Patient and/or legal guardian verbally consented to [facility] services about presenting concerns and psychiatric consultation as appropriate.  The services will be billed as appropriate for the patient   Orders: -     busPIRone HCl; Take 1 tablet (5 mg total) by mouth 2 (two) times daily.  Dispense: 60 tablet; Refill: 1 -     Amb ref to Integrated Behavioral Health  Attention deficit hyperactivity disorder (ADHD), combined type Assessment & Plan: Will treat today with Wellbutrin XL 150 mg daily Recommended  neuropsychiatric testing to obtain a definitive diagnosis of Attention Deficit Disorder (ADD). -Here are some options to get the neuropsychiatric testing to have a definitive answer on if you have ADD or not: Eula Flax, NP at [facility] [phone]) or [facility] [phone]) or Ronney Asters at [facility]. [facility] (part of [facility]) [phone] has multiple providers. [facility] [phone] has several providers specializing in ADHD/Bipolar= Andrena Mews, PhD is expert at Adult ADHD, Verna Czech, PhD treats adults with both diagnoses.  Orders: -     buPROPion HCl ER (XL); Take 1 tablet (150 mg total) by mouth daily.  Dispense: 30 tablet; Refill: 1  IFG (impaired fasting glucose) -     Hemoglobin A1c  Vitamin D deficiency -     VITAMIN D 25 Hydroxy (Vit-D Deficiency, Fractures)  Need for hepatitis C screening test -     Hepatitis C antibody  Encounter for screening for HIV -     HIV Antibody (routine testing w rflx)  Other specified hypothyroidism -     TSH + free T4  Other hyperlipidemia -     Lipid panel -     CMP14+EGFR -     CBC with Differential/Platelet    Note: This chart has been completed using Engineer, civil (consulting) software, and while attempts have been made to ensure accuracy, certain words and phrases may not be transcribed as intended.   Follow-up: Return in about 6 weeks (around 02/19/2023).   Gilmore Laroche, FNP

## 2023-01-08 NOTE — Assessment & Plan Note (Signed)
Will treat today with BuSpar 5 mg twice daily Encourage nonpharmacological interventions such as mindfulness, meditation, and deep breathing exercises Referral placed to integrated behavioral health for talk therapy   Patient and/or legal guardian verbally consented to [facility] services about presenting concerns and psychiatric consultation as appropriate.  The services will be billed as appropriate for the patient

## 2023-01-08 NOTE — Patient Instructions (Addendum)
I appreciate the opportunity to provide care to you today!    Follow up:  6 weeks  Labs: please stop by the lab during the week to get your blood drawn (CBC, CMP, TSH, Lipid profile, HgA1c, Vit D)  Screening: HIV and Hep C  Anxiety Start taking BuSpar 5 mg twice daily to help manage your anxiety. A referral has been placed to [facility] for talk therapy with Arlys John. Please adhere to the nonpharmacological interventions as directed to support your treatment. Nonpharmacologic management of anxiety   Mindfulness and Meditation Practices like mindfulness meditation can help reduce symptoms by promoting relaxation and present-moment awareness.  Exercise  Regular physical activity has been shown to improve mood and reduce anxiety through the release of endorphins and other neurochemicals.  Healthy Diet Eating a balanced diet rich in fruits, vegetables, whole grains, and lean proteins can support overall mental health.  Sleep Hygiene  Establishing a regular sleep routine and ensuring good sleep quality can significantly impact mood and anxiety levels.  Stress Management Techniques Activities such as yoga, tai chi, and deep breathing exercises can help manage stress.  Social Support Maintaining strong relationships and seeking support from friends, family, or support groups can provide emotional comfort and reduce feelings of isolation.  Lifestyle Modifications Reducing alcohol and caffeine intake, quitting smoking, and avoiding recreational drugs can improve symptoms.  Art and Music Therapy Engaging in creative activities like painting, drawing, or playing music can be therapeutic and help express emotions.  Light Therapy Particularly useful for seasonal affective disorder (SAD), exposure to bright light can help regulate mood. .   ADHD -A prescription for Wellbutrin 150 mg daily has been sent to your pharmacy for you to start taking. -I also recommend neuropsychiatric  testing to obtain a definitive diagnosis of Attention Deficit Disorder (ADD). -Here are some options to get the neuropsychiatric testing to have a definitive answer on if you have ADD or not: Eula Flax, NP at [facility] [phone]) or [facility] [phone]) or Ronney Asters at [facility]. [facility] (part of [facility]) [phone] has multiple providers. [facility] [phone] has several providers specializing in ADHD/Bipolar= Andrena Mews, PhD is expert at Adult ADHD, Verna Czech, PhD treats adults with both diagnoses.   Referrals today- integrated behavioral health    Please continue to a heart-healthy diet and increase your physical activities. Try to exercise for at least five days a week.    It was a pleasure to see you and I look forward to continuing to work together on your health and well-being. Please do not hesitate to call the office if you need care or have questions about your care.  In case of emergency, please visit the Emergency Department for urgent care, or contact our clinic at [phone] to schedule an appointment. We're here to help you!   Have a wonderful day and week. With Gratitude, Gilmore Laroche MSN, FNP-BC

## 2023-02-19 ENCOUNTER — Ambulatory Visit: Payer: Medicaid Other | Admitting: Family Medicine

## 2023-02-22 ENCOUNTER — Encounter: Payer: Self-pay | Admitting: Family Medicine

## 2024-01-27 ENCOUNTER — Emergency Department (HOSPITAL_COMMUNITY)
Admission: EM | Admit: 2024-01-27 | Discharge: 2024-01-27 | Disposition: A | Discharge status: Home / Self Care | Attending: Emergency Medicine | Admitting: Emergency Medicine

## 2024-01-27 ENCOUNTER — Emergency Department (HOSPITAL_COMMUNITY)

## 2024-01-27 ENCOUNTER — Other Ambulatory Visit: Payer: Self-pay

## 2024-01-27 DIAGNOSIS — R1084 Generalized abdominal pain: Secondary | ICD-10-CM | POA: Diagnosis present

## 2024-01-27 DIAGNOSIS — R1032 Left lower quadrant pain: Secondary | ICD-10-CM

## 2024-01-27 DIAGNOSIS — R110 Nausea: Secondary | ICD-10-CM | POA: Insufficient documentation

## 2024-01-27 LAB — URINALYSIS, ROUTINE W REFLEX MICROSCOPIC
Bilirubin Urine: NEGATIVE
Glucose, UA: NEGATIVE mg/dL
Hgb urine dipstick: NEGATIVE
Ketones, ur: NEGATIVE mg/dL
Leukocytes,Ua: NEGATIVE
Nitrite: NEGATIVE
Protein, ur: NEGATIVE mg/dL
Specific Gravity, Urine: 1.005 (ref 1.005–1.030)
pH: 7 (ref 5.0–8.0)

## 2024-01-27 LAB — CBC WITH DIFFERENTIAL/PLATELET
Abs Immature Granulocytes: 0.01 K/uL (ref 0.00–0.07)
Basophils Absolute: 0 K/uL (ref 0.0–0.1)
Basophils Relative: 1 %
Eosinophils Absolute: 0.1 K/uL (ref 0.0–0.5)
Eosinophils Relative: 2 %
HCT: 43.9 % (ref 39.0–52.0)
Hemoglobin: 15.5 g/dL (ref 13.0–17.0)
Immature Granulocytes: 0 %
Lymphocytes Relative: 40 %
Lymphs Abs: 2.4 K/uL (ref 0.7–4.0)
MCH: 30.4 pg (ref 26.0–34.0)
MCHC: 35.3 g/dL (ref 30.0–36.0)
MCV: 86.1 fL (ref 80.0–100.0)
Monocytes Absolute: 0.7 K/uL (ref 0.1–1.0)
Monocytes Relative: 12 %
Neutro Abs: 2.7 K/uL (ref 1.7–7.7)
Neutrophils Relative %: 45 %
Platelets: 197 K/uL (ref 150–400)
RBC: 5.1 MIL/uL (ref 4.22–5.81)
RDW: 12.4 % (ref 11.5–15.5)
WBC: 6 K/uL (ref 4.0–10.5)
nRBC: 0 % (ref 0.0–0.2)

## 2024-01-27 LAB — COMPREHENSIVE METABOLIC PANEL WITH GFR
ALT: 36 U/L (ref 0–44)
AST: 46 U/L — ABNORMAL HIGH (ref 15–41)
Albumin: 4.4 g/dL (ref 3.5–5.0)
Alkaline Phosphatase: 75 U/L (ref 38–126)
Anion gap: 11 (ref 5–15)
BUN: 10 mg/dL (ref 6–20)
CO2: 26 mmol/L (ref 22–32)
Calcium: 9.2 mg/dL (ref 8.9–10.3)
Chloride: 104 mmol/L (ref 98–111)
Creatinine, Ser: 0.96 mg/dL (ref 0.61–1.24)
GFR, Estimated: >60 mL/min (ref >60)
Glucose, Bld: 110 mg/dL — ABNORMAL HIGH (ref 70–99)
Potassium: 4.1 mmol/L (ref 3.5–5.1)
Sodium: 141 mmol/L (ref 135–145)
Total Bilirubin: 0.3 mg/dL (ref 0.0–1.2)
Total Protein: 7.1 g/dL (ref 6.5–8.1)

## 2024-01-27 LAB — LIPASE, BLOOD: Lipase: 35 U/L (ref 11–51)

## 2024-01-27 MED ORDER — LACTATED RINGERS IV BOLUS
1000.0000 mL | Freq: Once | INTRAVENOUS | Status: AC
  Administered 2024-01-27: 1000 mL via INTRAVENOUS

## 2024-01-27 MED ORDER — IOHEXOL 300 MG/ML  SOLN
100.0000 mL | Freq: Once | INTRAMUSCULAR | Status: AC | PRN
  Administered 2024-01-27: 100 mL via INTRAVENOUS

## 2024-01-27 MED ORDER — HYDROMORPHONE HCL 1 MG/ML IJ SOLN
1.0000 mg | Freq: Once | INTRAMUSCULAR | Status: AC
  Administered 2024-01-27: 1 mg via INTRAVENOUS
  Filled 2024-01-27: qty 1

## 2024-01-27 MED ORDER — DICYCLOMINE HCL 20 MG PO TABS: ORAL_TABLET | ORAL | 0 refills | Status: AC

## 2024-01-27 NOTE — ED Provider Notes (Signed)
 Emergency Medicine Provider Triage Evaluation Note  John Campbell , a 23 y.o. male  was evaluated in triage.  Pt complains of acute lower abdominal pain and distension. Decreased BM. Self induced vomiting.  Review of Systems  Positive: above Negative: Fever, nausea, diarrhea, constipation, etoh, drugs, tobacco, surgical history  Physical Exam  BP 127/86 (BP Location: Right Arm)   Pulse 61   Temp 97.6 F (36.4 C)   Resp 17   Ht 5' 10 (1.778 m)   Wt 72.6 kg   SpO2 100%   BMI 22.96 kg/m  Gen:   Awake, no distress   Resp:  Normal effort  MSK:   Moves extremities without difficulty  Other:  Abdominal ttp, no rebound or guarding  Medical Decision Making  Medically screening exam initiated at 6:46 AM.  Appropriate orders placed.  John Campbell was informed that the remainder of the evaluation will be completed by another provider, this initial triage assessment does not replace that evaluation, and the importance of remaining in the ED until their evaluation is complete.    Lorette Mayo, MD 01/27/24 [phone]

## 2024-01-27 NOTE — ED Triage Notes (Signed)
 Patient come in POV for complaint of abdominal pain, just woke up hurting at 6am. Discribed as feeling swollen throughout whole abdomen. LBM 01/27/2024. One emesis this morning with no relief.

## 2024-01-27 NOTE — Discharge Instructions (Signed)
Follow up with Dr. Jenetta Downer if not improving

## 2024-01-27 NOTE — ED Provider Notes (Signed)
 [facility] EMERGENCY DEPARTMENT AT [facility] Provider Note   CSN: [id number] Arrival date & time: 01/27/24  9372     Patient presents with: Abdominal Pain   Mandel Seiden is a 23 y.o. male.   Patient complains of lower abdominal cramping.  Some nausea  The history is provided by the patient and medical records. No language interpreter was used.  Abdominal Pain Pain location:  Generalized Pain quality: aching   Pain radiates to:  Does not radiate Pain severity:  Mild Onset quality:  Sudden Timing:  Constant Progression:  Worsening Chronicity:  New Context: not alcohol use   Relieved by:  Nothing Associated symptoms: no chest pain, no cough, no diarrhea, no fatigue and no hematuria        Prior to Admission medications   Medication Sig Start Date End Date Taking? Authorizing Provider  buPROPion  (WELLBUTRIN  XL) 150 MG 24 hr tablet Take 1 tablet (150 mg total) by mouth daily. 01/08/23   Bacchus, Meade PEDLAR, FNP  busPIRone  (BUSPAR ) 5 MG tablet Take 1 tablet (5 mg total) by mouth 2 (two) times daily. 01/08/23   Bacchus, Meade PEDLAR, FNP    Allergies: Latex and Other    Review of Systems  Constitutional:  Negative for appetite change and fatigue.  HENT:  Negative for congestion, ear discharge and sinus pressure.   Eyes:  Negative for discharge.  Respiratory:  Negative for cough.   Cardiovascular:  Negative for chest pain.  Gastrointestinal:  Positive for abdominal pain. Negative for diarrhea.  Genitourinary:  Negative for frequency and hematuria.  Musculoskeletal:  Negative for back pain.  Skin:  Negative for rash.  Neurological:  Negative for seizures and headaches.  Psychiatric/Behavioral:  Negative for hallucinations.     Updated Vital Signs BP 111/67   Pulse (!) 49   Temp 97.6 F (36.4 C)   Resp 17   Ht 5' 10 (1.778 m)   Wt 72.6 kg   SpO2 94%   BMI 22.96 kg/m   Physical Exam Vitals and nursing note reviewed.  Constitutional:      Appearance: He  is well-developed.  HENT:     Head: Normocephalic.     Mouth/Throat:     Mouth: Mucous membranes are moist.  Eyes:     General: No scleral icterus.    Conjunctiva/sclera: Conjunctivae normal.  Neck:     Thyroid: No thyromegaly.  Cardiovascular:     Rate and Rhythm: Normal rate and regular rhythm.     Heart sounds: No murmur heard.    No friction rub. No gallop.  Pulmonary:     Breath sounds: No stridor. No wheezing or rales.  Chest:     Chest wall: No tenderness.  Abdominal:     General: There is no distension.     Tenderness: There is abdominal tenderness. There is no rebound.  Musculoskeletal:        General: Normal range of motion.     Cervical back: Neck supple.  Lymphadenopathy:     Cervical: No cervical adenopathy.  Skin:    Findings: No erythema or rash.  Neurological:     Mental Status: He is alert and oriented to person, place, and time.     Motor: No abnormal muscle tone.     Coordination: Coordination normal.  Psychiatric:        Behavior: Behavior normal.     (all labs ordered are listed, but only abnormal results are displayed) Labs Reviewed  COMPREHENSIVE METABOLIC PANEL WITH  GFR - Abnormal; Notable for the following components:      Result Value   Glucose, Bld 110 (*)    AST 46 (*)    All other components within normal limits  URINALYSIS, ROUTINE W REFLEX MICROSCOPIC - Abnormal; Notable for the following components:   Color, Urine STRAW (*)    All other components within normal limits  LIPASE, BLOOD  CBC WITH DIFFERENTIAL/PLATELET    EKG: None  Radiology: CT ABDOMEN PELVIS W CONTRAST Result Date: 01/27/2024 CLINICAL DATA:  Lower abdominal pain and swelling. EXAM: CT ABDOMEN AND PELVIS WITH CONTRAST TECHNIQUE: Multidetector CT imaging of the abdomen and pelvis was performed using the standard protocol following bolus administration of intravenous contrast. RADIATION DOSE REDUCTION: This exam was performed according to the departmental  dose-optimization program which includes automated exposure control, adjustment of the mA and/or kV according to patient size and/or use of iterative reconstruction technique. CONTRAST:  OMNIPAQUE  IOHEXOL  300 MG/ML  SOLN COMPARISON:  Sep 05, 2019 FINDINGS: Lower chest: No acute abnormality. Hepatobiliary: No focal liver abnormality is seen. No gallstones, gallbladder wall thickening, or biliary dilatation. Pancreas: Unremarkable. No pancreatic ductal dilatation or surrounding inflammatory changes. Spleen: Normal in size without focal abnormality. Adrenals/Urinary Tract: Adrenal glands are unremarkable. Kidneys are normal, without renal calculi, focal lesion, or hydronephrosis. Bladder is unremarkable. Stomach/Bowel: Stomach is within normal limits. Appendix appears normal. No evidence of bowel wall thickening, distention, or inflammatory changes. Vascular/Lymphatic: No significant vascular findings are present. No enlarged abdominal or pelvic lymph nodes. Reproductive: Prostate is unremarkable. Other: No abdominal wall hernia or abnormality. No abdominopelvic ascites. Musculoskeletal: No acute or significant osseous findings. IMPRESSION: No acute or active process within the abdomen or pelvis. Electronically Signed   By: Suzen Dials M.D.   On: 01/27/2024 10:48     Procedures   Medications Ordered in the ED  HYDROmorphone (DILAUDID) injection 1 mg (1 mg Intravenous Given 01/27/24 0652)  lactated ringers  bolus 1,000 mL (0 mLs Intravenous Stopped 01/27/24 0825)  iohexol  (OMNIPAQUE ) 300 MG/ML solution 100 mL (100 mLs Intravenous Contrast Given 01/27/24 9062)                                    Medical Decision Making Amount and/or Complexity of Data Reviewed Labs: ordered.  Risk Prescription drug management.  Patient with abdominal cramping and negative CT.  Patient will be sent home with Bentyl  and referred to GI     Final diagnoses:  None    ED Discharge Orders     None           Suzette Pac, MD 01/27/24 1747
